# Patient Record
Sex: Female | Born: 1965 | Race: White | Hispanic: No | Marital: Married | Smoking: Never smoker
Health system: Southern US, Community
[De-identification: ages and names within clinical notes are randomized; demographics above are authoritative.]

## PROBLEM LIST (undated history)

## (undated) DIAGNOSIS — T148XXA Other injury of unspecified body region, initial encounter: Secondary | ICD-10-CM

## (undated) DIAGNOSIS — R87619 Unspecified abnormal cytological findings in specimens from cervix uteri: Secondary | ICD-10-CM

## (undated) DIAGNOSIS — E7800 Pure hypercholesterolemia, unspecified: Secondary | ICD-10-CM

## (undated) HISTORY — PX: POPLITEAL SYNOVIAL CYST EXCISION: SUR555

## (undated) HISTORY — DX: Unspecified abnormal cytological findings in specimens from cervix uteri: R87.619

## (undated) HISTORY — PX: DILATION AND CURETTAGE OF UTERUS: SHX78

## (undated) HISTORY — DX: Pure hypercholesterolemia, unspecified: E78.00

## (undated) HISTORY — DX: Other injury of unspecified body region, initial encounter: T14.8XXA

---
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
## 2000-07-06 ENCOUNTER — Encounter: Payer: Self-pay | Admitting: *Deleted

## 2000-07-06 ENCOUNTER — Ambulatory Visit (HOSPITAL_COMMUNITY): Admission: RE | Admit: 2000-07-06 | Discharge: 2000-07-06 | Payer: Self-pay | Admitting: *Deleted

## 2001-01-15 ENCOUNTER — Inpatient Hospital Stay (HOSPITAL_COMMUNITY): Admission: AD | Admit: 2001-01-15 | Discharge: 2001-01-17 | Payer: Self-pay | Admitting: Pediatrics

## 2001-02-20 ENCOUNTER — Other Ambulatory Visit: Admission: RE | Admit: 2001-02-20 | Discharge: 2001-02-20 | Payer: Self-pay | Admitting: *Deleted

## 2002-12-13 ENCOUNTER — Emergency Department (HOSPITAL_COMMUNITY): Admission: EM | Admit: 2002-12-13 | Discharge: 2002-12-13 | Payer: Self-pay | Admitting: Emergency Medicine

## 2003-01-30 ENCOUNTER — Other Ambulatory Visit: Admission: RE | Admit: 2003-01-30 | Discharge: 2003-01-30 | Payer: Self-pay | Admitting: Obstetrics and Gynecology

## 2004-06-28 ENCOUNTER — Other Ambulatory Visit: Admission: RE | Admit: 2004-06-28 | Discharge: 2004-06-28 | Payer: Self-pay | Admitting: Obstetrics and Gynecology

## 2005-07-12 ENCOUNTER — Other Ambulatory Visit: Admission: RE | Admit: 2005-07-12 | Discharge: 2005-07-12 | Payer: Self-pay | Admitting: Obstetrics and Gynecology

## 2006-04-02 ENCOUNTER — Ambulatory Visit (HOSPITAL_COMMUNITY): Admission: RE | Admit: 2006-04-02 | Discharge: 2006-04-02 | Payer: Self-pay | Admitting: Obstetrics and Gynecology

## 2006-07-17 ENCOUNTER — Other Ambulatory Visit: Admission: RE | Admit: 2006-07-17 | Discharge: 2006-07-17 | Payer: Self-pay | Admitting: Obstetrics & Gynecology

## 2007-04-04 ENCOUNTER — Ambulatory Visit (HOSPITAL_COMMUNITY): Admission: RE | Admit: 2007-04-04 | Discharge: 2007-04-04 | Payer: Self-pay | Admitting: Obstetrics and Gynecology

## 2007-07-23 ENCOUNTER — Other Ambulatory Visit: Admission: RE | Admit: 2007-07-23 | Discharge: 2007-07-23 | Payer: Self-pay | Admitting: Obstetrics & Gynecology

## 2008-04-30 ENCOUNTER — Ambulatory Visit (HOSPITAL_COMMUNITY): Admission: RE | Admit: 2008-04-30 | Discharge: 2008-04-30 | Payer: Self-pay | Admitting: Obstetrics and Gynecology

## 2008-08-10 ENCOUNTER — Other Ambulatory Visit: Admission: RE | Admit: 2008-08-10 | Discharge: 2008-08-10 | Payer: Self-pay | Admitting: Obstetrics and Gynecology

## 2009-05-03 ENCOUNTER — Ambulatory Visit (HOSPITAL_COMMUNITY): Admission: RE | Admit: 2009-05-03 | Discharge: 2009-05-03 | Payer: Self-pay | Admitting: Obstetrics and Gynecology

## 2010-05-04 ENCOUNTER — Ambulatory Visit (HOSPITAL_COMMUNITY): Admission: RE | Admit: 2010-05-04 | Discharge: 2010-05-04 | Payer: Self-pay | Admitting: Obstetrics and Gynecology

## 2011-02-17 NOTE — H&P (Signed)
[facility]  Patient:    Rebecca Browning, Rebecca Browning                         MRN: [id number] Adm. Date:  01/14/01 Attending:  Marina Gravel, M.D.                         History and Physical  ADMISSION DIAGNOSES:          1. Intrauterine pregnancy at 39-6/7th weeks.                               2. History of precipitous labor.  INTENDED PROCEDURE:           Artificial rupture of membranes for induction of labor.  SURGEON:                      Marina Gravel, M.D.  INDICATIONS FOR PROCEDURE:    The patient is a 45 year old white female, gravida 4 para 2 A1, at 39-6/7th weeks, who presents for rupture of membranes for induction of labor due to history and precipitous labor.  Prenatal care at [facility], Dr. Earlene Plater as primary.  No complications.  The patient has advanced maternal age and had a normal ultrasound and AFP.  She declined amniocentesis.  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 The patient has been on Valtrex prophylaxis for 35 weeks and has had no symptoms.  She has a history of group B Streptococcus in previous pregnancies but the test from this pregnancy at 35 weeks was negative.  PAST OBSTETRIC HISTORY:       1. Blighted ovum with D&C in 1994, no                                  complications.                               2. In 1995 delivered a 7 pound 4 ounce SVD, no                                  complications except for elevated blood                                  pressure at the end of the pregnancy.                               3. In 1998 delivery of a 7 pound 11 ounce                                  spontaneous vaginal delivery.  Total time of                                  labor was two to three hours.  Otherwise, no  complications.  PAST MEDICAL HISTORY:         HSV as outlined above.  No recent symptoms.  PAST SURGICAL HISTORY:        Knee cyst removed.  FAMILY HISTORY:                Hypertension and coronary artery disease.  SOCIAL HISTORY:               No alcohol, tobacco, or other drugs.  ALLERGIES:                    None.  PRENATAL LABORATORY DATA:     Blood type A-positive.  Rubella immune. Hepatitis B, HIV, RPR, group B strep all negative.  Glucola 130.  PHYSICAL EXAMINATION:  VITAL SIGNS:                  Blood pressure 120/70 and after recheck 140/92. Weight 156 pounds.  Fetal heart tones 146.  GENERAL:                      Alert and oriented, in no acute distress.  SKIN:                         Warm without lesions.  HEART:                        Regular rate and rhythm.  LUNGS:                        Clear to auscultation.  ABDOMEN:                      Gravid.  Estimated fetal weight 7-1/2 pounds.  PELVIC:                       Cervix 1-2 cm, 50% effaced, -1 station, vertex presentation.  No HSV lesions noted.  ASSESSMENT:                   Term intrauterine pregnancy, with history of rapid labor, with favorable cervix.  PLAN:                         Artificial rupture of membranes for induction of labor. DD:  01/14/01 TD:  01/14/01 Job: [id number]

## 2011-04-03 ENCOUNTER — Other Ambulatory Visit: Payer: Self-pay | Admitting: Obstetrics and Gynecology

## 2011-04-03 DIAGNOSIS — Z1231 Encounter for screening mammogram for malignant neoplasm of breast: Secondary | ICD-10-CM

## 2011-05-08 ENCOUNTER — Ambulatory Visit (HOSPITAL_COMMUNITY)
Admission: RE | Admit: 2011-05-08 | Discharge: 2011-05-08 | Disposition: A | Discharge status: Home / Self Care | Payer: Medicare HMO | Source: Ambulatory Visit | Attending: Obstetrics and Gynecology | Admitting: Obstetrics and Gynecology

## 2011-05-08 DIAGNOSIS — Z1231 Encounter for screening mammogram for malignant neoplasm of breast: Secondary | ICD-10-CM

## 2012-04-01 ENCOUNTER — Other Ambulatory Visit: Payer: Self-pay | Admitting: Obstetrics and Gynecology

## 2012-04-01 DIAGNOSIS — Z1231 Encounter for screening mammogram for malignant neoplasm of breast: Secondary | ICD-10-CM

## 2012-05-09 ENCOUNTER — Ambulatory Visit (HOSPITAL_COMMUNITY)
Admission: RE | Admit: 2012-05-09 | Discharge: 2012-05-09 | Disposition: A | Discharge status: Home / Self Care | Payer: Medicare HMO | Source: Ambulatory Visit | Attending: Obstetrics and Gynecology | Admitting: Obstetrics and Gynecology

## 2012-05-09 DIAGNOSIS — Z1231 Encounter for screening mammogram for malignant neoplasm of breast: Secondary | ICD-10-CM

## 2013-04-14 ENCOUNTER — Other Ambulatory Visit: Payer: Self-pay | Admitting: Obstetrics and Gynecology

## 2013-04-14 DIAGNOSIS — Z1231 Encounter for screening mammogram for malignant neoplasm of breast: Secondary | ICD-10-CM

## 2013-05-12 ENCOUNTER — Ambulatory Visit (HOSPITAL_COMMUNITY)
Admission: RE | Admit: 2013-05-12 | Discharge: 2013-05-12 | Disposition: A | Discharge status: Home / Self Care | Payer: 59 | Source: Ambulatory Visit | Attending: Obstetrics and Gynecology | Admitting: Obstetrics and Gynecology

## 2013-05-12 DIAGNOSIS — Z1231 Encounter for screening mammogram for malignant neoplasm of breast: Secondary | ICD-10-CM | POA: Insufficient documentation

## 2013-08-20 ENCOUNTER — Encounter: Payer: Self-pay | Admitting: Nurse Practitioner

## 2013-08-21 ENCOUNTER — Encounter: Payer: Self-pay | Admitting: Nurse Practitioner

## 2013-08-21 ENCOUNTER — Ambulatory Visit (INDEPENDENT_AMBULATORY_CARE_PROVIDER_SITE_OTHER): Payer: Managed Care, Other (non HMO) | Admitting: Nurse Practitioner

## 2013-08-21 VITALS — BP 120/78 | HR 56 | Resp 16 | Ht 65.0 in | Wt 153.0 lb

## 2013-08-21 DIAGNOSIS — Z0000 Encounter for general adult medical examination without abnormal findings: Secondary | ICD-10-CM

## 2013-08-21 DIAGNOSIS — Z01419 Encounter for gynecological examination (general) (routine) without abnormal findings: Secondary | ICD-10-CM

## 2013-08-21 LAB — POCT URINALYSIS DIPSTICK
Bilirubin, UA: NEGATIVE
Blood, UA: NEGATIVE
Glucose, UA: NEGATIVE
Ketones, UA: NEGATIVE

## 2013-08-21 LAB — HEMOGLOBIN, FINGERSTICK: Hemoglobin, fingerstick: 13.4 g/dL (ref 12.0–16.0)

## 2013-08-21 NOTE — Patient Instructions (Signed)

## 2013-08-21 NOTE — Progress Notes (Signed)
Patient ID: Rebecca Browning, female   DOB: [birth year], 47 y.o.   MRN: [id number] 47 y.o. [id number] Married Caucasian Fe here for annual exam.  No new health problems. Still working on cholesterol.  No significant vaso symptoms.  Amenorrhea since 12/2009 with negative Provera challenge.  FSH 115.2  in 08/2010.  No LMP recorded. Patient is postmenopausal.          Sexually active: yes  The current method of family planning is post menopausal status.    Exercising: yes  Gym/ health club routine includes light weights, stair stepper , treadmill and yoga. Smoker:  no  Health Maintenance: Pap: 08/19/12, WNL, neg HR HPV MMG: 05/12/13, Bi-Rads 1 Negative TDaP: 08/10/08 Labs: HB: 13.4  Urine: 1+ WBC, pH 6.0   reports that she has never smoked. She has never used smokeless tobacco. She reports that she drinks alcohol. She reports that she does not use illicit drugs.  History reviewed. No pertinent past medical history.  Past Surgical History  Procedure Laterality Date  . Dilation and curettage of uterus      SAB  . Popliteal synovial cyst excision  1970's    Current Outpatient Prescriptions  Medication Sig Dispense Refill  . Multiple Vitamin (MULTIVITAMIN) tablet Take 1 tablet by mouth daily. [employer] Multipack       No current facility-administered medications for this visit.    Family History  Problem Relation Age of Onset  . Breast cancer Sister 110  . Hypertension Mother   . Heart attack Father   . Heart disease Father     CABG  . Hypertension Father   . Hyperlipidemia Father     ROS:  Pertinent items are noted in HPI.  Otherwise, a comprehensive ROS was negative.  Exam:   BP 120/78  Pulse 56  Resp 16  Ht 5\' 5"  (1.651 m)  Wt 153 lb (69.4 kg)  BMI 25.46 kg/m2 Height: 5\' 5"  (165.1 cm)  Ht Readings from Last 3 Encounters:  08/21/13 5\' 5"  (1.651 m)    General appearance: alert, cooperative and appears stated age Head: Normocephalic, without obvious abnormality, atraumatic Neck: no  adenopathy, supple, symmetrical, trachea midline and thyroid normal to inspection and palpation Lungs: clear to auscultation bilaterally Breasts: normal appearance, no masses or tenderness Heart: regular rate and rhythm Abdomen: soft, non-tender; no masses,  no organomegaly Extremities: extremities normal, atraumatic, no cyanosis or edema Skin: Skin color, texture, turgor normal. No rashes or lesions Lymph nodes: Cervical, supraclavicular, and axillary nodes normal. No abnormal inguinal nodes palpated Neurologic: Grossly normal   Pelvic: External genitalia:  no lesions              Urethra:  normal appearing urethra with no masses, tenderness or lesions              Bartholin's and Skene's: normal                 Vagina: normal appearing vagina with normal color and discharge, no lesions              Cervix: anteverted              Pap taken: no Bimanual Exam:  Uterus:  normal size, contour, position, consistency, mobility, non-tender              Adnexa: no mass, fullness, tenderness               Rectovaginal: Confirms  Anus:  normal sphincter tone, no lesions  A:  Well Woman with normal exam  Postmenopausal no HRT  [facility] of sister with breast cancer - no BRCA testing - pt aware that she can have testing done.   P:   Pap smear as per guidelines   Mammogram due 05/2014  Info given about BRCA  Will follow with labs  Counseled on breast self exam, adequate intake of calcium and vitamin D, diet and exercise return annually or prn  An After Visit Summary was printed and given to the patient.

## 2013-08-22 LAB — LIPID PANEL
Cholesterol: 216 mg/dL — ABNORMAL HIGH (ref 0–200)
LDL Cholesterol: 138 mg/dL — ABNORMAL HIGH (ref 0–99)
Total CHOL/HDL Ratio: 3.6 Ratio
VLDL: 18 mg/dL (ref 0–40)

## 2013-08-25 NOTE — Progress Notes (Signed)
Encounter reviewed by Dr. Brook Silva.  

## 2013-08-27 ENCOUNTER — Telehealth: Payer: Self-pay | Admitting: *Deleted

## 2013-08-27 NOTE — Telephone Encounter (Signed)
I have attempted to contact this patient by phone with the following results: left message to return my call on answering machine (home).  

## 2013-08-27 NOTE — Telephone Encounter (Signed)
Message copied by Luisa Dago on Wed Aug 27, 2013 12:12 PM ------      Message from: Ria Comment R      Created: Fri Aug 22, 2013  8:19 AM       Let patient know results.  past lipid panel showed elevated cholesterol of 262 11/13, recheck/14 was 249 and now at 216 - much better!  Good work with diet and supplements.  LDL was 171 11/13 and now at 138 - still needs some work but better. ------

## 2013-09-01 NOTE — Telephone Encounter (Signed)
Pt notified of cholesterol results.

## 2014-03-11 ENCOUNTER — Ambulatory Visit (INDEPENDENT_AMBULATORY_CARE_PROVIDER_SITE_OTHER): Payer: Managed Care, Other (non HMO) | Admitting: Nurse Practitioner

## 2014-03-11 ENCOUNTER — Encounter: Payer: Self-pay | Admitting: Nurse Practitioner

## 2014-03-11 ENCOUNTER — Telehealth: Payer: Self-pay | Admitting: Nurse Practitioner

## 2014-03-11 VITALS — BP 122/76 | HR 64 | Ht 65.0 in | Wt 156.0 lb

## 2014-03-11 DIAGNOSIS — B373 Candidiasis of vulva and vagina: Secondary | ICD-10-CM

## 2014-03-11 DIAGNOSIS — B3731 Acute candidiasis of vulva and vagina: Secondary | ICD-10-CM

## 2014-03-11 MED ORDER — NYSTATIN-TRIAMCINOLONE 100000-0.1 UNIT/GM-% EX OINT: 1.0000 "application " | TOPICAL_OINTMENT | Freq: Two times a day (BID) | CUTANEOUS | Status: DC

## 2014-03-11 MED ORDER — FLUCONAZOLE 150 MG PO TABS
150.0000 mg | ORAL_TABLET | Freq: Once | ORAL | Status: DC
Start: 2014-03-11 — End: 2014-08-31

## 2014-03-11 NOTE — Patient Instructions (Signed)
Monilial Vaginitis  Vaginitis in a soreness, swelling and redness (inflammation) of the vagina and vulva. Monilial vaginitis is not a sexually transmitted infection.  CAUSES   Yeast vaginitis is caused by yeast (candida) that is normally found in your vagina. With a yeast infection, the candida has overgrown in number to a point that upsets the chemical balance.  SYMPTOMS   · White, thick vaginal discharge.  · Swelling, itching, redness and irritation of the vagina and possibly the lips of the vagina (vulva).  · Burning or painful urination.  · Painful intercourse.  DIAGNOSIS   Things that may contribute to monilial vaginitis are:  · Postmenopausal and virginal states.  · Pregnancy.  · Infections.  · Being tired, sick or stressed, especially if you had monilial vaginitis in the past.  · Diabetes. Good control will help lower the chance.  · Birth control pills.  · Tight fitting garments.  · Using bubble bath, feminine sprays, douches or deodorant tampons.  · Taking certain medications that kill germs (antibiotics).  · Sporadic recurrence can occur if you become ill.  TREATMENT   Your caregiver will give you medication.  · There are several kinds of anti monilial vaginal creams and suppositories specific for monilial vaginitis. For recurrent yeast infections, use a suppository or cream in the vagina 2 times a week, or as directed.  · Anti-monilial or steroid cream for the itching or irritation of the vulva may also be used. Get your caregiver's permission.  · Painting the vagina with methylene blue solution may help if the monilial cream does not work.  · Eating yogurt may help prevent monilial vaginitis.  HOME CARE INSTRUCTIONS   · Finish all medication as prescribed.  · Do not have sex until treatment is completed or after your caregiver tells you it is okay.  · Take warm sitz baths.  · Do not douche.  · Do not use tampons, especially scented ones.  · Wear cotton underwear.  · Avoid tight pants and panty  hose.  · Tell your sexual partner that you have a yeast infection. They should go to their caregiver if they have symptoms such as mild rash or itching.  · Your sexual partner should be treated as well if your infection is difficult to eliminate.  · Practice safer sex. Use condoms.  · Some vaginal medications cause latex condoms to fail. Vaginal medications that harm condoms are:  · Cleocin cream.  · Butoconazole (Femstat®).  · Terconazole (Terazol®) vaginal suppository.  · Miconazole (Monistat®) (may be purchased over the counter).  SEEK MEDICAL CARE IF:   · You have a temperature by mouth above 102° F (38.9° C).  · The infection is getting worse after 2 days of treatment.  · The infection is not getting better after 3 days of treatment.  · You develop blisters in or around your vagina.  · You develop vaginal bleeding, and it is not your menstrual period.  · You have pain when you urinate.  · You develop intestinal problems.  · You have pain with sexual intercourse.  Document Released: 06/28/2005 Document Revised: 12/11/2011 Document Reviewed: 03/12/2009  Exit[employer]® Patient Information ©2014 [employer].

## 2014-03-11 NOTE — Telephone Encounter (Signed)
Pt wants to talk with nurse no information given. °

## 2014-03-11 NOTE — Progress Notes (Signed)
48 y.o.Married Caucasian female [phone] with a 2 week(s) history of the following :burning and discharge described as white, curd-like and milky.   Sexually active: yes Last sexual activity:4days ago. Pt also reports the following associated symptoms: none.  Patient has tried over the counter treatment with Monistat with minimal relief. No change in soaps detergents, or personal care items.   No recent antibiotics.       Exam:  Ext:  Redness and irritation extending down to the anus.                HYQ:MVHQIONGE: white, thick and curd-like                Cx:  normal appearance                Uterus:not examined                Adnexa: not evaluated  Wet Prep shows:yeast, NSS: negative, PH: 4.5   Dx: monilia vaginitis   TX  :Symptomatic local care discussed. Transport planner distributed. Vaginal antifungal see orders. Oral antifungal see orders.

## 2014-03-11 NOTE — Telephone Encounter (Signed)
Spoke with patient. Patient states that two weeks ago she began to have vaginal itching and discharge. Patient used OTC Monistat 3 and symptoms went away for a couple of days but have now returned. Patient would like rx for Diflucan sent in to pharmacy. Advised patient that she will need to come in for office visit with Lauro Franklin, FNP for culture and proper treatment. Patient agreeable. 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 Patient states that she could come today if there are openings. Appointment scheduled for today at 12:45pm with Lauro Franklin, FNP. Patient agreeable to date and time.  Routing to provider for final review. Patient agreeable to disposition. Will close encounter

## 2014-03-16 NOTE — Progress Notes (Signed)
Encounter reviewed by Dr. Adrina Armijo Silva.  

## 2014-04-29 ENCOUNTER — Other Ambulatory Visit: Payer: Self-pay | Admitting: Nurse Practitioner

## 2014-04-29 DIAGNOSIS — Z1231 Encounter for screening mammogram for malignant neoplasm of breast: Secondary | ICD-10-CM

## 2014-05-20 ENCOUNTER — Ambulatory Visit (HOSPITAL_COMMUNITY)
Admission: RE | Admit: 2014-05-20 | Discharge: 2014-05-20 | Disposition: A | Discharge status: Home / Self Care | Payer: 59 | Source: Ambulatory Visit | Attending: Nurse Practitioner | Admitting: Nurse Practitioner

## 2014-05-20 DIAGNOSIS — Z1231 Encounter for screening mammogram for malignant neoplasm of breast: Secondary | ICD-10-CM | POA: Insufficient documentation

## 2014-08-03 ENCOUNTER — Encounter: Payer: Self-pay | Admitting: Nurse Practitioner

## 2014-08-19 HISTORY — PX: LIPOSUCTION: SHX10

## 2014-08-31 ENCOUNTER — Encounter: Payer: Self-pay | Admitting: Nurse Practitioner

## 2014-08-31 ENCOUNTER — Ambulatory Visit (INDEPENDENT_AMBULATORY_CARE_PROVIDER_SITE_OTHER): Payer: Managed Care, Other (non HMO) | Admitting: Nurse Practitioner

## 2014-08-31 VITALS — BP 108/66 | HR 72 | Ht 65.75 in | Wt 157.0 lb

## 2014-08-31 DIAGNOSIS — Z01419 Encounter for gynecological examination (general) (routine) without abnormal findings: Secondary | ICD-10-CM

## 2014-08-31 DIAGNOSIS — Z1211 Encounter for screening for malignant neoplasm of colon: Secondary | ICD-10-CM

## 2014-08-31 DIAGNOSIS — Z0000 Encounter for general adult medical examination without abnormal findings: Secondary | ICD-10-CM

## 2014-08-31 DIAGNOSIS — R8290 Unspecified abnormal findings in urine: Secondary | ICD-10-CM

## 2014-08-31 LAB — POCT URINALYSIS DIPSTICK
BILIRUBIN UA: NEGATIVE
GLUCOSE UA: NEGATIVE
Ketones, UA: NEGATIVE
NITRITE UA: NEGATIVE
RBC UA: NEGATIVE
Urobilinogen, UA: NEGATIVE
pH, UA: 5

## 2014-08-31 LAB — HEMOGLOBIN, FINGERSTICK: HEMOGLOBIN, FINGERSTICK: 11.7 g/dL — AB (ref 12.0–16.0)

## 2014-08-31 NOTE — Patient Instructions (Signed)

## 2014-08-31 NOTE — Progress Notes (Signed)
Patient ID: Rebecca Browning, female   DOB: 1966-02-05, 48 y.o.   MRN: [id number] 48 y.o. [id number] Married Caucasian Fe here for annual exam.  Vaso symptoms are tolerable. She had liposuction done a week ago on the thighs.  Patient's last menstrual period was 01/03/2010 (exact date).          Sexually active: yes  The current method of family planning is post menopausal status and vasectomy.  Exercising: yes Gym/ health club routine includes light weights, stair stepper , treadmill and yoga. Smoker: no  Health Maintenance: Pap: 08/19/12, WNL, neg HR HPV MMG: 05/20/14, Bi-Rads 1:  Negative TDaP: 08/10/08 Labs:  HB:  11.7   Urine:  1+ leuk's    reports that she has never smoked. She has never used smokeless tobacco. She reports that she drinks alcohol. She reports that she does not use illicit drugs.  History reviewed. No pertinent past medical history.  Past Surgical History  Procedure Laterality Date  . Dilation and curettage of uterus      SAB  . Popliteal synovial cyst excision  1970's  . Liposuction Bilateral 08/19/14    thighs    Current Outpatient Prescriptions  Medication Sig Dispense Refill  . Multiple Vitamin (MULTIVITAMIN) tablet Take 1 tablet by mouth daily. [place] Multipack     No current facility-administered medications for this visit.    Family History  Problem Relation Age of Onset  . Breast cancer Sister 78  . Hypertension Mother   . Heart attack Father   . Heart disease Father     CABG  . Hypertension Father   . Hyperlipidemia Father     ROS:  Pertinent items are noted in HPI.  Otherwise, a comprehensive ROS was negative.  Exam:   BP 108/66 mmHg  Pulse 72  Ht 5' 5.75" (1.67 m)  Wt 157 lb (71.215 kg)  BMI 25.54 kg/m2  LMP 01/03/2010 (Exact Date) Height: 5' 5.75" (167 cm)  Ht Readings from Last 3 Encounters:  08/31/14 5' 5.75" (1.67 m)  03/11/14 5' 5"  (1.651 m)  08/21/13 5' 5"  (1.651 m)    General appearance: alert, cooperative and appears  stated age Head: Normocephalic, without obvious abnormality, atraumatic Neck: no adenopathy, supple, symmetrical, trachea midline and thyroid normal to inspection and palpation Lungs: clear to auscultation bilaterally Breasts: normal appearance, no masses or tenderness Heart: regular rate and rhythm Abdomen: soft, non-tender; no masses,  no organomegaly Extremities: extremities normal, atraumatic, no cyanosis or edema Skin: Skin color, texture, turgor normal. No rashes or lesions.  Bilateral bruising of the thighs Lymph nodes: Cervical, supraclavicular, and axillary nodes normal. No abnormal inguinal nodes palpated Neurologic: Grossly normal   Pelvic: External genitalia:  no lesions              Urethra:  normal appearing urethra with no masses, tenderness or lesions              Bartholin's and Skene's: normal                 Vagina: normal appearing vagina with normal color and discharge, no lesions              Cervix: anteverted              Pap taken: No. Bimanual Exam:  Uterus:  normal size, contour, position, consistency, mobility, non-tender              Adnexa: no mass, fullness, tenderness  Rectovaginal: Confirms               Anus:  normal sphincter tone, no lesions  A:  Well Woman with normal exam  Postmenopausal no HRT [facility] of sister with breast cancer - no BRCA testing - pt aware that she can have testing done.  P:   Reviewed health and wellness pertinent to exam  Pap smear not taken today  Mammogram is due 8/16  Follow with urine C&S - asymptomatic  Referral to GI for screening colonoscopy  Counseled on breast self exam, mammography screening, adequate intake of calcium and vitamin D, diet and exercise return annually or prn  An After Visit Summary was printed and given to the patient.

## 2014-08-31 NOTE — Progress Notes (Signed)
Encounter reviewed by Dr. Brook Silva.  

## 2014-09-01 LAB — URINE CULTURE

## 2014-09-15 LAB — HM COLONOSCOPY: HM Colonoscopy: NORMAL

## 2014-10-20 ENCOUNTER — Telehealth: Payer: Self-pay | Admitting: Nurse Practitioner

## 2014-10-20 NOTE — Telephone Encounter (Signed)
Please have mom come with the daughter.  2 sets of ears to hear all the questions and answers are better.

## 2014-10-20 NOTE — Telephone Encounter (Signed)
Patient has some for Rebecca GoltzPatty Browning. Patient is wondering Rebecca Goltzatty Browning would be the appropriate person to see her daughter before scheduling an appointment. Patient's [facility]

## 2014-10-20 NOTE — Telephone Encounter (Signed)
Spoke with patient. Patient states "I have been a patient with [name] for years. My daughter is now 6918 and sees a dermatologist for acne. They want to put her on acutane but say she needs to be on birth control. Is this something she would come to see [name] for as a new patient?" Advised she will be able to see Rebecca FranklinPatricia Rolen-Grubb, FNP for a new patient appointment to discuss birth control. Advised will need to schedule new patient appointment for daughter. Mother is agreeable. Pass phone call to Starla to schedule new patient appointment.   Routing to provider for final review. Patient agreeable to disposition. Will close encounter

## 2014-10-21 NOTE — Telephone Encounter (Signed)
Spoke with patient. Advised of message as seen below. Patient [facility] Lauro FranklinPatricia Rolen-Grubb, FNP and she will attend appointment.  Routing to provider for final review. Patient agreeable to disposition. Will close encounter

## 2014-11-17 ENCOUNTER — Telehealth: Payer: Self-pay | Admitting: Nurse Practitioner

## 2014-11-17 NOTE — Telephone Encounter (Signed)
Needs OV to evaluate

## 2014-11-17 NOTE — Telephone Encounter (Signed)
Spoke with patient. Advised of message as seen below from Lauro FranklinPatricia Rolen-Grubb, FNP. Patient is agreeable and verbalizes understanding. 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 Appointment scheduled for 2/22 at 12:45pm with Lauro FranklinPatricia Rolen-Grubb, FNP. Patient is agreeable to date and time.  Routing to provider for final review. Patient agreeable to disposition. Will close encounter

## 2014-11-17 NOTE — Telephone Encounter (Signed)
Patient has some question for [name] Grubb's nurse. No details given. Last seen 08/31/14.

## 2014-11-17 NOTE — Telephone Encounter (Signed)
Spoke with patient. Patient states that when she was seen last on 08/31/2014 with Rebecca FranklinPatricia Rolen-Grubb, FNP "She noticed a rash that I have around my anal area. It has been there for a while. She mentioned it may be fungal but I thought it may be due to moisture. Over the last two days it has become more irritated and itches/burns. It comes and goes." Denies any spreading of the rash. No change in BM's. Patient is requesting to know what Rebecca FranklinPatricia Rolen-Grubb, FNP recommends she use to "treat" the area. Advised will need to speak with Rebecca FranklinPatricia Rolen-Grubb, FNP and return call with further recommendations. Patient is agreeable.

## 2014-11-23 ENCOUNTER — Ambulatory Visit: Payer: Managed Care, Other (non HMO) | Admitting: Nurse Practitioner

## 2014-11-23 ENCOUNTER — Telehealth: Payer: Self-pay | Admitting: Nurse Practitioner

## 2014-11-23 NOTE — Telephone Encounter (Signed)
Pt has appt today @1245 . States she is not having the problem any more but wants to make sure with our nurse before cancelling appointment.  Tagged as high priority due to time frame before appointment

## 2014-11-23 NOTE — Telephone Encounter (Signed)
Call to patient. She states her symptoms have resolved and does not feel appointment is necessary at this time. Patient wishes to cancel, but states she will come in if there will be cancellation fee applied. Spoke with Billie RuddySally Yeakley, RN  Okay to cancel appointment, no fee applied, since triaged issues have resolved.   Appointment cancelled.   Routing to provider for final review. Patient agreeable to disposition. Will close encounter

## 2015-04-09 ENCOUNTER — Other Ambulatory Visit: Payer: Self-pay | Admitting: Nurse Practitioner

## 2015-04-09 DIAGNOSIS — Z1231 Encounter for screening mammogram for malignant neoplasm of breast: Secondary | ICD-10-CM

## 2015-05-25 ENCOUNTER — Ambulatory Visit (HOSPITAL_COMMUNITY)
Admission: RE | Admit: 2015-05-25 | Discharge: 2015-05-25 | Disposition: A | Discharge status: Home / Self Care | Payer: Managed Care, Other (non HMO) | Source: Ambulatory Visit | Attending: Nurse Practitioner | Admitting: Nurse Practitioner

## 2015-05-25 DIAGNOSIS — Z1231 Encounter for screening mammogram for malignant neoplasm of breast: Secondary | ICD-10-CM | POA: Diagnosis not present

## 2015-09-08 ENCOUNTER — Encounter: Payer: Self-pay | Admitting: Nurse Practitioner

## 2015-09-08 ENCOUNTER — Ambulatory Visit (INDEPENDENT_AMBULATORY_CARE_PROVIDER_SITE_OTHER): Payer: Managed Care, Other (non HMO) | Admitting: Nurse Practitioner

## 2015-09-08 VITALS — BP 116/74 | HR 62 | Ht 64.75 in | Wt 158.0 lb

## 2015-09-08 DIAGNOSIS — Z01419 Encounter for gynecological examination (general) (routine) without abnormal findings: Secondary | ICD-10-CM

## 2015-09-08 DIAGNOSIS — Z0000 Encounter for general adult medical examination without abnormal findings: Secondary | ICD-10-CM | POA: Diagnosis not present

## 2015-09-08 LAB — WET PREP BY MOLECULAR PROBE
Candida species: NEGATIVE
Gardnerella vaginalis: NEGATIVE
TRICHOMONAS VAG: NEGATIVE

## 2015-09-08 NOTE — Patient Instructions (Signed)

## 2015-09-08 NOTE — Progress Notes (Signed)
Patient ID: Rebecca Browning, female   DOB: 02-11-1966, 50 y.o.   MRN: [id number]  49 y.o. [id number] Married  Caucasian Fe here for annual exam.   Mother age 44 now diagnosed with breast cancer lumpectomy and radiation. Will call [facility] to get further info on BRCA testing.  No vaso symptoms, sleep is good, some vaginal dryness.  Patient's last menstrual period was 01/03/2010 (exact date).          Sexually active: Yes.    The current method of family planning is post menopausal status.    Exercising: Yes.    yoga, cardio 3 times per week and tennis Smoker:  no  Health Maintenance: Pap: 08/19/12, Negative with neg HR HPV MMG: 05/25/15, Bi-Rads 1 Negative Colonoscopy:  09/2014, Normal, repeat in 10 years, Dr. Collene Mares TDaP: 08/10/08 Labs:  Dr. Philip Aspen takes care of all labs   reports that she has never smoked. She has never used smokeless tobacco. She reports that she drinks alcohol. She reports that she does not use illicit drugs.  History reviewed. No pertinent past medical history.  Past Surgical History  Procedure Laterality Date  . Dilation and curettage of uterus      SAB  . Popliteal synovial cyst excision  1970's  . Liposuction Bilateral 08/19/14    thighs    Current Outpatient Prescriptions  Medication Sig Dispense Refill  . Multiple Vitamin (MULTIVITAMIN) tablet Take 1 tablet by mouth daily. [employer] Multipack    . zolpidem (AMBIEN) 10 MG tablet Take 1 tablet by mouth at bedtime as needed.  3   No current facility-administered medications for this visit.    Family History  Problem Relation Age of Onset  . Breast cancer Sister 71  . Hypertension Mother   . Breast cancer Mother 43    lumpectomy, radiation  . Heart attack Father   . Heart disease Father     CABG  . Hypertension Father   . Hyperlipidemia Father     ROS:  Pertinent items are noted in HPI.  Otherwise, a comprehensive ROS was negative.  Exam:   BP 116/74 mmHg  Pulse 62  Ht 5' 4.75" (1.645 m)  Wt 158 lb  (71.668 kg)  BMI 26.48 kg/m2  LMP 01/03/2010 (Exact Date) Height: 5' 4.75" (164.5 cm) Ht Readings from Last 3 Encounters:  09/08/15 5' 4.75" (1.645 m)  08/31/14 5' 5.75" (1.67 m)  03/11/14 5' 5"  (1.651 m)    General appearance: alert, cooperative and appears stated age Head: Normocephalic, without obvious abnormality, atraumatic Neck: no adenopathy, supple, symmetrical, trachea midline and thyroid normal to inspection and palpation Lungs: clear to auscultation bilaterally Breasts: normal appearance, no masses or tenderness Heart: regular rate and rhythm Abdomen: soft, non-tender; no masses,  no organomegaly Extremities: extremities normal, atraumatic, no cyanosis or edema Skin: Skin color, texture, turgor normal. No rashes or lesions Lymph nodes: Cervical, supraclavicular, and axillary nodes normal. No abnormal inguinal nodes palpated Neurologic: Grossly normal   Pelvic: External genitalia:  no lesions              Urethra:  normal appearing urethra with no masses, tenderness or lesions              Bartholin's and Skene's: normal                 Vagina: normal appearing vagina with normal color and yellow tint vaginal discharge, no lesions              Cervix:  anteverted              Pap taken: Yes.   Bimanual Exam:  Uterus:  normal size, contour, position, consistency, mobility, non-tender              Adnexa: no mass, fullness, tenderness               Rectovaginal: Confirms               Anus:  normal sphincter tone, no lesions  Chaperone present: yes  A:  Well Woman with normal exam  Postmenopausal no HRT [facility] of mother & sister with breast cancer - no BRCA testing - pt aware that she can have testing done and given [facility] information  R/O vagintis   P:   Reviewed health and wellness pertinent to exam  Pap smear as above  Mammogram is due 05/2016  Will follow with [facility]  Mother age 51 now diagnosed with breast cancer lumpectomy and radiation.  Sister in  law also diagnosed with breast cancer age 87.  She has 3 daughters,  Will call [facility] to get further info on BRCA testing  Counseled on breast self exam, mammography screening, adequate intake of calcium and vitamin D, diet and exercise return annually or prn  An After Visit Summary was printed and given to the patient.

## 2015-09-08 NOTE — Progress Notes (Signed)
Encounter reviewed Jill Jertson, MD   

## 2015-09-09 ENCOUNTER — Telehealth: Payer: Self-pay

## 2015-09-09 LAB — HIV ANTIBODY (ROUTINE TESTING W REFLEX): HIV 1&2 Ab, 4th Generation: NONREACTIVE

## 2015-09-09 LAB — HEPATITIS C ANTIBODY: HCV Ab: NEGATIVE

## 2015-09-09 NOTE — Telephone Encounter (Signed)
Returned call and left a message on voicemail during lunch to please call her back.

## 2015-09-09 NOTE — Telephone Encounter (Signed)
Spoke with patient. Results given as seen below. Patient is agreeable and verbalizes understanding.  Routing to provider for final review. Patient agreeable to disposition. Will close encounter   

## 2015-09-09 NOTE — Telephone Encounter (Signed)
-----   Message from Ria CommentPatricia Grubb, FNP sent at 09/09/2015  8:29 AM EST ----- Please let patient know that the wet prep was negative.  The HIV and Hep C was negative as suspected.

## 2015-09-09 NOTE — Telephone Encounter (Signed)
Left message to call Landri Dorsainvil at [phone]. 

## 2015-09-10 LAB — IPS PAP TEST WITH HPV

## 2016-02-16 ENCOUNTER — Encounter: Payer: Self-pay | Admitting: Nurse Practitioner

## 2016-02-16 ENCOUNTER — Telehealth: Payer: Self-pay | Admitting: *Deleted

## 2016-02-16 ENCOUNTER — Ambulatory Visit (INDEPENDENT_AMBULATORY_CARE_PROVIDER_SITE_OTHER): Payer: Managed Care, Other (non HMO) | Admitting: Nurse Practitioner

## 2016-02-16 VITALS — BP 110/62 | HR 76 | Temp 97.9°F | Resp 16 | Ht 65.0 in | Wt 150.0 lb

## 2016-02-16 DIAGNOSIS — N390 Urinary tract infection, site not specified: Secondary | ICD-10-CM | POA: Diagnosis not present

## 2016-02-16 LAB — POCT URINALYSIS DIPSTICK
Bilirubin, UA: NEGATIVE
Glucose, UA: NEGATIVE
KETONES UA: NEGATIVE
NITRITE UA: NEGATIVE
PH UA: 5
Urobilinogen, UA: NEGATIVE

## 2016-02-16 MED ORDER — PHENAZOPYRIDINE HCL 200 MG PO TABS: 200.0000 mg | ORAL_TABLET | Freq: Three times a day (TID) | ORAL | Status: DC | PRN

## 2016-02-16 MED ORDER — CIPROFLOXACIN HCL 500 MG PO TABS: 500.0000 mg | ORAL_TABLET | Freq: Two times a day (BID) | ORAL | Status: DC

## 2016-02-16 MED ORDER — FLUCONAZOLE 150 MG PO TABS: 150.0000 mg | ORAL_TABLET | Freq: Once | ORAL | Status: DC

## 2016-02-16 NOTE — Telephone Encounter (Signed)
Patient thinks she has a uti, patient would like to know if something can be called in for her. Best # to reach: [phone]

## 2016-02-16 NOTE — Progress Notes (Signed)
50 y.o.Married Caucasian female [phone] here with complaint of UTI, with onset about 2 weeks ago. Patient complaining of:  Urinary frequency, urinary urgency, burning with urination. Patient denies fever, chills, nausea or back pain. No new personal products. Patient feels is to sexual activity. She was trying to be SA more often to help with vaginal atrophy changes.  Denies vaginal symptoms.    Contraception is post menopausal.  No change in partner. Post menopausal with occasional vaginal dryness. Patient has adequate water intake.  On Bactrim for a week  5/5 - 5/12 and completed all med's   Soon afterwards knew that symptoms were back. No kidney pain.  No vaginal discharge.    O: Healthy female WDWN Affect: Normal, orientation x 3 Skin : warm and dry CVAT: negative bilateral Abdomen: positive for suprapubic tenderness   POCT:  Cloudy, trace RBC, 100 mg protein, 2+ leuk's  A:  UTI  PC UTI  P: Reviewed findings of UTI and need for treatment. Rx:  Cipro 500 mg BID # 14 Rx: Pyridium 200 mg prn If culture comes back and she is sensitive to Macrobid - may also try using Macrobid PC prn. JYN:WGNFALab:Urine micro, culture Reviewed warning signs and symptoms of UTI and need to advise if occurring. Encouraged to limit soda, tea, and coffee   RV prn

## 2016-02-16 NOTE — Patient Instructions (Signed)

## 2016-02-16 NOTE — Telephone Encounter (Signed)
Spoke with patient. Patient states that she feels she has a UTI and is requesting medication be sent to the pharmacy for her. Advised she will need to be seen in the office for evaluation to ensure proper treatment. She is agreeable. Appointment scheduled for today 02/16/2016 at 12:45 pm with Ria CommentPatricia Grubb, FNP. She is agreeable to date and time.  Routing to provider for final review. Patient agreeable to disposition. Will close encounter.

## 2016-02-17 LAB — URINALYSIS, MICROSCOPIC ONLY
Bacteria, UA: NONE SEEN [HPF]
CASTS: NONE SEEN [LPF]
CRYSTALS: NONE SEEN [HPF]
RBC / HPF: NONE SEEN RBC/HPF (ref <=2)
SQUAMOUS EPITHELIAL / LPF: NONE SEEN [HPF] (ref <=5)
Yeast: NONE SEEN [HPF]

## 2016-02-18 ENCOUNTER — Telehealth: Payer: Self-pay | Admitting: *Deleted

## 2016-02-18 NOTE — Telephone Encounter (Signed)
Phone call to patient per Shirlyn GoltzPatty Grubb, FNP to advise urine culture result is still pending.  Advised pt preliminary result shows she is growing bacteria, but we do not have the sensitivity testing result.  Pt states she is feeling better on Cipro.  Patient wanted to know if Macrobid has been called in for use after intercourse.  Advised I do not see a prescription and I will ask Shirlyn GoltzPatty Grubb, FNP about this.  Please advise regarding Macrobid.

## 2016-02-19 LAB — URINE CULTURE: Colony Count: 85000

## 2016-02-20 NOTE — Progress Notes (Signed)
Encounter reviewed by Dr. Brook Amundson C. Silva.  

## 2016-02-21 ENCOUNTER — Other Ambulatory Visit: Payer: Self-pay | Admitting: Nurse Practitioner

## 2016-02-21 MED ORDER — NITROFURANTOIN MONOHYD MACRO 100 MG PO CAPS: ORAL_CAPSULE | ORAL | Status: DC

## 2016-02-21 NOTE — Telephone Encounter (Signed)
Return call to Stephanie. °

## 2016-02-21 NOTE — Telephone Encounter (Signed)
The urine results are now back and the RX for Macrobid was sent in to use PC.

## 2016-02-21 NOTE — Telephone Encounter (Signed)
Notified of urine culture results.  Patient is agreeable with plan.

## 2016-06-15 ENCOUNTER — Other Ambulatory Visit: Payer: Self-pay | Admitting: Nurse Practitioner

## 2016-06-15 DIAGNOSIS — Z1231 Encounter for screening mammogram for malignant neoplasm of breast: Secondary | ICD-10-CM

## 2016-06-26 ENCOUNTER — Ambulatory Visit
Admission: RE | Admit: 2016-06-26 | Discharge: 2016-06-26 | Disposition: A | Discharge status: Home / Self Care | Payer: Managed Care, Other (non HMO) | Source: Ambulatory Visit | Attending: Nurse Practitioner | Admitting: Nurse Practitioner

## 2016-06-26 DIAGNOSIS — Z1231 Encounter for screening mammogram for malignant neoplasm of breast: Secondary | ICD-10-CM

## 2016-09-12 ENCOUNTER — Ambulatory Visit (INDEPENDENT_AMBULATORY_CARE_PROVIDER_SITE_OTHER): Payer: Managed Care, Other (non HMO) | Admitting: Nurse Practitioner

## 2016-09-12 ENCOUNTER — Encounter: Payer: Self-pay | Admitting: Nurse Practitioner

## 2016-09-12 VITALS — BP 126/84 | HR 56 | Ht 65.0 in | Wt 151.0 lb

## 2016-09-12 DIAGNOSIS — Z0000 Encounter for general adult medical examination without abnormal findings: Secondary | ICD-10-CM | POA: Diagnosis not present

## 2016-09-12 DIAGNOSIS — Z803 Family history of malignant neoplasm of breast: Secondary | ICD-10-CM | POA: Diagnosis not present

## 2016-09-12 DIAGNOSIS — R300 Dysuria: Secondary | ICD-10-CM | POA: Diagnosis not present

## 2016-09-12 DIAGNOSIS — Z01419 Encounter for gynecological examination (general) (routine) without abnormal findings: Secondary | ICD-10-CM

## 2016-09-12 LAB — POCT URINALYSIS DIPSTICK
Bilirubin, UA: NEGATIVE
Blood, UA: NEGATIVE
GLUCOSE UA: NEGATIVE
Ketones, UA: NEGATIVE
NITRITE UA: NEGATIVE
PROTEIN UA: NEGATIVE
Urobilinogen, UA: NEGATIVE
pH, UA: 5

## 2016-09-12 MED ORDER — NITROFURANTOIN MONOHYD MACRO 100 MG PO CAPS: ORAL_CAPSULE | ORAL | 12 refills | Status: DC

## 2016-09-12 NOTE — Progress Notes (Signed)
Patient ID: Rebecca Browning, female   DOB: 1966/07/23, 50 y.o.   MRN: [id number]  50 y.o. [id number] Married  Caucasian Fe here for annual exam.  She feels well.  Since UTI problems in June she has been on [facility] and has done well.  About 3 weeks ago she had slight increase in dysuria and called the Tele doctor and was told to increase Macrobid to BID X 3 days. Symptoms seemed to go away until now.  Maybe slight dysuria past 1 day.  Denies fever, chills, flank pain.  Patient's last menstrual period was 01/03/2010 (exact date).          Sexually active: Yes.    The current method of family planning is post menopausal status.    Exercising: Yes.    cardio and weights Smoker:  no  Health Maintenance: Pap: 09/08/15, Negative with neg HR HPV MMG: 06/26/16,  Bi-Rads 1 Negative Colonoscopy: 09/15/14, Normal, repeat in 10 years BMD: Never TDaP: 08/10/08 Hep C and HIV: 09/08/15 Labs: PCP takes care of all labs  Urine: 1+ leuk's (dysuria)   reports that she has never smoked. She has never used smokeless tobacco. She reports that she drinks alcohol. She reports that she does not use drugs.  No past medical history on file.  Past Surgical History:  Procedure Laterality Date  . DILATION AND CURETTAGE OF UTERUS     SAB  . LIPOSUCTION Bilateral 08/19/14   thighs  . POPLITEAL SYNOVIAL CYST EXCISION  1970's    Current Outpatient Prescriptions  Medication Sig Dispense Refill  . ciprofloxacin (CIPRO) 500 MG tablet Take 1 tablet (500 mg total) by mouth 2 (two) times daily. 14 tablet 0  . fluconazole (DIFLUCAN) 150 MG tablet Take 1 tablet (150 mg total) by mouth once. Take one tablet.  Repeat in 48 hours if symptoms are not completely resolved. 2 tablet 1  . Multiple Vitamin (MULTIVITAMIN) tablet Take 1 tablet by mouth daily. [employer] Multipack    . nitrofurantoin, macrocrystal-monohydrate, (MACROBID) 100 MG capsule 1 cap PC (post coital) prn 30 capsule 0  . phenazopyridine (PYRIDIUM) 200 MG tablet Take 1  tablet (200 mg total) by mouth 3 (three) times daily as needed for pain. 20 tablet 0  . zolpidem (AMBIEN) 10 MG tablet Take 1 tablet by mouth at bedtime as needed.  3   No current facility-administered medications for this visit.     Family History  Problem Relation Age of Onset  . Breast cancer Sister 77  . Hypertension Mother   . Breast cancer Mother 96    lumpectomy, radiation  . Heart attack Father   . Heart disease Father     CABG  . Hypertension Father   . Hyperlipidemia Father     ROS:  Pertinent items are noted in HPI.  Otherwise, a comprehensive ROS was negative.  Exam:   LMP 01/03/2010 (Exact Date)    Ht Readings from Last 3 Encounters:  02/16/16 _0  (1.651 m)  09/08/15 5' 4.75" (1.645 m)  08/31/14 5' 5.75" (1.67 m)    General appearance: alert, cooperative and appears stated age Head: Normocephalic, without obvious abnormality, atraumatic Neck: no adenopathy, supple, symmetrical, trachea midline and thyroid normal to inspection and palpation Lungs: clear to auscultation bilaterally Breasts: normal appearance, no masses or tenderness Heart: regular rate and rhythm Abdomen: soft, non-tender; no masses,  no organomegaly, no flank pain Extremities: extremities normal, atraumatic, no cyanosis or edema Skin: Skin color, texture, turgor normal. No rashes or  lesions Lymph nodes: Cervical, supraclavicular, and axillary nodes normal. No abnormal inguinal nodes palpated Neurologic: Grossly normal   Pelvic: External genitalia:  no lesions              Urethra:  normal appearing urethra with no masses, tenderness or lesions              Bartholin's and Skene's: normal                 Vagina: normal appearing vagina with normal color and discharge, no lesions              Cervix: anteverted              Pap taken: No. Bimanual Exam:  Uterus:  normal size, contour, position, consistency, mobility, non-tender              Adnexa: no mass, fullness, tenderness                Rectovaginal: Confirms               Anus:  normal sphincter tone, no lesions  Chaperone present: yes  A:  Well Woman with normal exam  Postmenopausal no HRT [place] of mother & sister with breast cancer - no BRCA testing - pt aware that she can have testing done and will get consult for genetics - she wants to have done as she has daughters            R/O UTI  History of PC UTI - does well on Macrobid prn    P:   Reviewed health and wellness pertinent to exam  Pap smear as above  Mammogram is due 06/2017  Will make a referral to genetics  Follow with urine culture and micro  Refill Macrobid prn counseled on breast self exam, mammography screening, adequate intake of calcium and vitamin D, diet and exercise, Kegel's exercises return annually or prn  An After Visit Summary was printed and given to the patient.

## 2016-09-12 NOTE — Patient Instructions (Addendum)

## 2016-09-13 LAB — URINALYSIS, MICROSCOPIC ONLY
BACTERIA UA: NONE SEEN [HPF]
Casts: NONE SEEN [LPF]
Crystals: NONE SEEN [HPF]
RBC / HPF: NONE SEEN RBC/HPF (ref <=2)
SQUAMOUS EPITHELIAL / LPF: NONE SEEN [HPF] (ref <=5)
Yeast: NONE SEEN [HPF]

## 2016-09-13 LAB — URINE CULTURE: ORGANISM ID, BACTERIA: NO GROWTH

## 2016-09-14 NOTE — Progress Notes (Signed)
Encounter reviewed by Dr. Brook Amundson C. Silva.  

## 2017-04-12 ENCOUNTER — Other Ambulatory Visit: Payer: Self-pay | Admitting: Nurse Practitioner

## 2017-04-12 DIAGNOSIS — Z1231 Encounter for screening mammogram for malignant neoplasm of breast: Secondary | ICD-10-CM

## 2017-04-30 ENCOUNTER — Telehealth: Payer: Self-pay | Admitting: Obstetrics & Gynecology

## 2017-04-30 NOTE — Telephone Encounter (Signed)
LMTCB/:NP/ .CX/LETTER SENT/RD ° °

## 2017-06-28 ENCOUNTER — Ambulatory Visit
Admission: RE | Admit: 2017-06-28 | Discharge: 2017-06-28 | Disposition: A | Discharge status: Home / Self Care | Payer: Managed Care, Other (non HMO) | Source: Ambulatory Visit | Attending: Nurse Practitioner | Admitting: Nurse Practitioner

## 2017-06-28 DIAGNOSIS — Z1231 Encounter for screening mammogram for malignant neoplasm of breast: Secondary | ICD-10-CM

## 2017-09-14 ENCOUNTER — Ambulatory Visit: Payer: Managed Care, Other (non HMO) | Admitting: Nurse Practitioner

## 2017-09-14 ENCOUNTER — Ambulatory Visit (INDEPENDENT_AMBULATORY_CARE_PROVIDER_SITE_OTHER): Payer: 59 | Admitting: Certified Nurse Midwife

## 2017-09-14 ENCOUNTER — Other Ambulatory Visit: Payer: Self-pay

## 2017-09-14 ENCOUNTER — Encounter: Payer: Self-pay | Admitting: Certified Nurse Midwife

## 2017-09-14 ENCOUNTER — Other Ambulatory Visit (HOSPITAL_COMMUNITY)
Admission: RE | Admit: 2017-09-14 | Discharge: 2017-09-14 | Disposition: A | Discharge status: Home / Self Care | Payer: 59 | Source: Ambulatory Visit | Attending: Certified Nurse Midwife | Admitting: Certified Nurse Midwife

## 2017-09-14 VITALS — BP 114/70 | HR 58 | Resp 14 | Ht 64.5 in | Wt 154.0 lb

## 2017-09-14 DIAGNOSIS — Z23 Encounter for immunization: Secondary | ICD-10-CM

## 2017-09-14 DIAGNOSIS — Z803 Family history of malignant neoplasm of breast: Secondary | ICD-10-CM | POA: Diagnosis not present

## 2017-09-14 DIAGNOSIS — N951 Menopausal and female climacteric states: Secondary | ICD-10-CM

## 2017-09-14 DIAGNOSIS — Z124 Encounter for screening for malignant neoplasm of cervix: Secondary | ICD-10-CM | POA: Insufficient documentation

## 2017-09-14 DIAGNOSIS — Z8249 Family history of ischemic heart disease and other diseases of the circulatory system: Secondary | ICD-10-CM | POA: Insufficient documentation

## 2017-09-14 DIAGNOSIS — N390 Urinary tract infection, site not specified: Secondary | ICD-10-CM

## 2017-09-14 DIAGNOSIS — Z01419 Encounter for gynecological examination (general) (routine) without abnormal findings: Secondary | ICD-10-CM | POA: Diagnosis not present

## 2017-09-14 DIAGNOSIS — Z1151 Encounter for screening for human papillomavirus (HPV): Secondary | ICD-10-CM | POA: Insufficient documentation

## 2017-09-14 NOTE — Progress Notes (Signed)
51 y.o. [id number] Married  Caucasian Fe here for annual exam. Menopausal no HRT. Denies vaginal bleeding or vaginal dryness.  Sees PCP  Dr. Jarold MottoPatterson for aex, labs, all normal. Staying active with exercise and tennis. Macrobid working well for post coital UTI, desires continuance. Usually use only one to resolve issues. Daughters doing well and patients here. No health issues today. Taking [place] on trip for [place] to [place]  Patient's last menstrual period was 01/03/2010 (exact date).          Sexually active: Yes.    The current method of family planning is post menopausal status.    Exercising: Yes.    cardio, weights, yoga  Smoker:  no  Health Maintenance: Pap:  09-08-15 neg HPV HR neg History of Abnormal Pap: no MMG:  06-28-17 category b density birads 1:neg Self Breast exams: yes Colonoscopy:  2015 f/u 2376yrs BMD:   none TDaP:  2009 Shingles: negative Pneumonia: negative  Hep C and HIV: both neg 2016 Labs: PCP   reports that  has never smoked. she has never used smokeless tobacco. She reports that she drinks alcohol. She reports that she does not use drugs.  History reviewed. No pertinent past medical history.  Past Surgical History:  Procedure Laterality Date  . DILATION AND CURETTAGE OF UTERUS     SAB  . LIPOSUCTION Bilateral 08/19/14   thighs  . POPLITEAL SYNOVIAL CYST EXCISION  1970's    Current Outpatient Medications  Medication Sig Dispense Refill  . Calcium Carbonate-Vitamin D3 (CALCIUM 600/VITAMIN D) 600-400 MG-UNIT TABS Take 1 tablet by mouth daily.    . Multiple Vitamin (MULTIVITAMIN) tablet Take 1 tablet by mouth daily. [employer]NC Multipack    . nitrofurantoin, macrocrystal-monohydrate, (MACROBID) 100 MG capsule 1 cap PC (post coital) prn 30 capsule 12  . Omega-3 Fatty Acids (FISH OIL PO) Take by mouth.    . phenazopyridine (PYRIDIUM) 200 MG tablet Take 1 tablet (200 mg total) by mouth 3 (three) times daily as needed for pain. 20 tablet 0   No current  facility-administered medications for this visit.     Family History  Problem Relation Age of Onset  . Breast cancer Sister [id number]       mastectomy no chemo/ radiation  . Hypertension Mother   . Breast cancer Mother 1978       lumpectomy, radiation  . Heart attack Father   . Heart disease Father        CABG  . Hypertension Father   . Hyperlipidemia Father   . Cancer Father 3170       prostate  . Hyperlipidemia Brother   . Heart disease Maternal Uncle   . Hyperlipidemia Maternal Grandfather   . Heart disease Maternal Grandfather   . Hyperlipidemia Paternal Grandfather   . Heart disease Paternal Grandfather     ROS:  Pertinent items are noted in HPI.  Otherwise, a comprehensive ROS was negative.  Exam:   BP 114/70 (BP Location: Right Arm, Patient Position: Sitting, Cuff Size: Normal)   Pulse (!) 58   Resp 14   Ht 5' 4.5" (1.638 m)   Wt 154 lb (69.9 kg)   LMP 01/03/2010 (Exact Date)   BMI 26.03 kg/m  Height: 5' 4.5" (163.8 cm) Ht Readings from Last 3 Encounters:  09/14/17 5' 4.5" (1.638 m)  09/12/16 5\' 5"  (1.651 m)  02/16/16 5\' 5"  (1.651 m)    General appearance: alert, cooperative and appears stated age Head: Normocephalic, without obvious abnormality, atraumatic Neck: no  adenopathy, supple, symmetrical, trachea midline and thyroid normal to inspection and palpation Lungs: clear to auscultation bilaterally Breasts: normal appearance, no masses or tenderness, No nipple retraction or dimpling, No nipple discharge or bleeding, No axillary or supraclavicular adenopathy Heart: regular rate and rhythm Abdomen: soft, non-tender; no masses,  no organomegaly Extremities: extremities normal, atraumatic, no cyanosis or edema Skin: Skin color, texture, turgor normal. No rashes or lesions Lymph nodes: Cervical, supraclavicular, and axillary nodes normal. No abnormal inguinal nodes palpated Neurologic: Grossly normal   Pelvic: External genitalia:  no lesions              Urethra:   normal appearing urethra with no masses, tenderness or lesions              Bartholin's and Skene's: normal                 Vagina: normal appearing vagina with normal color and discharge, no lesions              Cervix: multiparous appearance, no bleeding following Pap and no lesions              Pap taken: Yes.   Bimanual Exam:  Uterus:  normal size, contour, position, consistency, mobility, non-tender              Adnexa: normal adnexa and no mass, fullness, tenderness               Rectovaginal: Confirms               Anus:  normal sphincter tone, no lesions  Chaperone present: yes  A:  Well Woman with normal exam  Menopausal no HRT  Post Coital UTI Macrobid/Pyridium use working well, desires refill   Family history of breast cancer, mother 70,sister 4540, no BRACA screening done  Immunization due  P:   Reviewed health and wellness pertinent to exam  Aware of need to evaluate if vaginal bleeding  Reviewed Macrobid and Pyridium use post coital.  Rx Macrobid see order with instructions  Rx Pyridium see order with instructions  Discussed genetic counseling or evaluation due to breast cancer history and patient has 3 daughters. Patient will consider and advise if she would like to schedule or have done. Daughters have increased risk with PA who had breast cancer also. Strongly encouraged. Questions addressed.  Continue follow up with MD for aex/labs  Requests TDAP  Pap smear: yes   counseled on breast self exam, mammography screening, feminine hygiene, adequate intake of calcium and vitamin D, diet and exercise  return annually or prn  An After Visit Summary was printed and given to the patient.

## 2017-09-14 NOTE — Patient Instructions (Signed)

## 2017-09-15 MED ORDER — NITROFURANTOIN MONOHYD MACRO 100 MG PO CAPS
ORAL_CAPSULE | ORAL | 12 refills | Status: DC
Start: 2017-09-15 — End: 2018-02-21

## 2017-09-15 MED ORDER — PHENAZOPYRIDINE HCL 200 MG PO TABS: 200.0000 mg | ORAL_TABLET | Freq: Three times a day (TID) | ORAL | 1 refills | Status: DC | PRN

## 2017-09-17 LAB — CYTOLOGY - PAP
Diagnosis: NEGATIVE
HPV (WINDOPATH): NOT DETECTED

## 2018-01-22 ENCOUNTER — Ambulatory Visit (INDEPENDENT_AMBULATORY_CARE_PROVIDER_SITE_OTHER): Payer: 59 | Admitting: Sports Medicine

## 2018-01-22 ENCOUNTER — Encounter: Payer: Self-pay | Admitting: Sports Medicine

## 2018-01-22 VITALS — BP 110/70 | Ht 65.0 in | Wt 150.0 lb

## 2018-01-22 DIAGNOSIS — M7711 Lateral epicondylitis, right elbow: Secondary | ICD-10-CM | POA: Diagnosis not present

## 2018-01-23 NOTE — Progress Notes (Signed)
   Subjective:    Patient ID: Rebecca Browning, female    DOB: 1966-01-21, 52 y.o.   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 Prior to her elbow pain, she had increased her playing time quite a bit but she denies changing racquet tension or grip. She also denies changing her tennis strokes. She has taken a little bit of time off which has helped. She also purchased a counter-force brace but it sounds like she has been wearing it incorrectly. She denies numbness or tingling. No prior occurrences. She has been taking intermittent doses of NSAIDs which have been helpful. No imaging has been done to date. No prior elbow surgeries.   Past medical history reviewed Medications reviewed Allergies reviewed   Review of Systems    as above Objective:   Physical Exam  Well-developed, well-nourished. No acute distress. Awake alert and oriented 3. Vital signs reviewed  Right elbow: Full range of motion. No effusion. No obvious soft tissue swelling. Slight tenderness to palpation over the lateral epicondyle. No tenderness to palpation at the insertion of the triceps tendon. Patient has reproducible pain with ECRB testing. Good pulses. No tenderness to palpation along the medial elbow. Good grip strength.  MSK ultrasound of the right elbow was performed. Limited images were obtained. There is a tiny spur off of the lateral epicondyle and some hypoechoic change to the deep portion of the tendon. Findings consistent with common extensor tendon tendinopathy and possible partial tearing.      Assessment & Plan:   Right elbow pain secondary to lateral  epicondylitis  Physical therapy for modalities and treatment to include eccentric strengthening exercises. The patient and physical therapist will also discuss merits of dry needling. I recommended that she avoid tennis until follow-up with me in 4 weeks. I educated her on the proper way to wear her counter-force brace She may continue with over-the-counter NSAIDs as needed. If symptoms persist a follow-up, consider topical nitroglycerin patch. Call with questions or concerns prior to her follow-up visit.

## 2018-01-28 ENCOUNTER — Other Ambulatory Visit: Payer: Self-pay

## 2018-01-28 ENCOUNTER — Ambulatory Visit: Payer: 59 | Attending: Sports Medicine | Admitting: Physical Therapy

## 2018-01-28 ENCOUNTER — Encounter: Payer: Self-pay | Admitting: Physical Therapy

## 2018-01-28 DIAGNOSIS — M25521 Pain in right elbow: Secondary | ICD-10-CM

## 2018-01-28 DIAGNOSIS — M6281 Muscle weakness (generalized): Secondary | ICD-10-CM | POA: Diagnosis present

## 2018-01-28 DIAGNOSIS — M62838 Other muscle spasm: Secondary | ICD-10-CM

## 2018-01-28 NOTE — Patient Instructions (Signed)
Access Code: [email or web address]  URL: [email or web address]  Date: 01/28/2018  Prepared by: Dorie Rank   Exercises  Standing Wrist Flexion Stretch - 3 reps - 1 sets - 30 sec hold - 3x daily - 7x weekly  Trigger Point Dry Needling  . What is Trigger Point Dry Needling (DN)? o DN is a physical therapy technique used to treat muscle pain and dysfunction. Specifically, DN helps deactivate muscle trigger points (muscle knots).  o A thin filiform needle is used to penetrate the skin and stimulate the underlying trigger point. The goal is for a local twitch response (LTR) to occur and for the trigger point to relax. No medication of any kind is injected during the procedure.   . What Does Trigger Point Dry Needling Feel Like?  o The procedure feels different for each individual patient. Some patients report that they do not actually feel the needle enter the skin and overall the process is not painful. Very mild bleeding may occur. However, many patients feel a deep cramping in the muscle in which the needle was inserted. This is the local twitch response.   Marland Kitchen How Will I feel after the treatment? o Soreness is normal, and the onset of soreness may not occur for a few hours. Typically this soreness does not last longer than two days.  o Bruising is uncommon, however; ice can be used to decrease any possible bruising.  o In rare cases feeling tired or nauseous after the treatment is normal. In addition, your symptoms may get worse before they get better, this period will typically not last longer than 24 hours.   . What Can I do After My Treatment? o Increase your hydration by drinking more water for the next 24 hours. o You may place ice or heat on the areas treated that have become sore, however, do not use heat on inflamed or bruised areas. Heat often brings more relief post needling. o You can continue your regular activities, but vigorous activity is not recommended initially after  the treatment for 24 hours. o DN is best combined with other physical therapy such as strengthening, stretching, and other therapies.    [facility] [address] [place], [place] [address] Phone # [phone] Fax [phone]

## 2018-01-28 NOTE — Therapy (Signed)
[facility] [address] [place], [place], [address] Phone: [phone]   Fax:  [phone]  Physical Therapy Evaluation  Patient Details  Name: Rebecca Browning MRN: [id number] Date of Birth: Nov 29, 1965 Referring Provider: Ralene Cork   Encounter Date: 01/28/2018  PT End of Session - 01/28/18 0946    Visit Number  1    Date for PT Re-Evaluation  03/11/18    Authorization Type  Aetna    Authorization - Visit Number  1    Authorization - Number of Visits  60    PT Start Time  0847    PT Stop Time  0928    PT Time Calculation (min)  41 min    Activity Tolerance  Patient tolerated treatment well    Behavior During Therapy  [facility] for tasks assessed/performed       History reviewed. No pertinent past medical history.  Past Surgical History:  Procedure Laterality Date  . DILATION AND CURETTAGE OF UTERUS     SAB  . LIPOSUCTION Bilateral 08/19/14   thighs  . POPLITEAL SYNOVIAL CYST EXCISION  1970's    There were no vitals filed for this visit.   Subjective Assessment - 01/28/18 0851    Subjective  Pt is a tennis player.  She is not currently playing tennis since having [place] last week which showed a partial tear.  she started playing more tennis.  Normally plays 2-3 days/week.  Pain was up to 4/10 during tennis with the use of brace, then 5/10 after playing and removing brace.  Hitting the ball forehand caused increased pain.  Not using much ice/heat.  Pain lasted about 10 minutes after playing tennis.    Limitations  Lifting;House hold activities exercises - tennis    Patient Stated Goals  get rid of pain    Currently in Pain?  No/denies         [facility] PT Assessment - 01/28/18 0001      Assessment   Medical Diagnosis  M77.11 (ICD-10-CM) - Right lateral epicondylitis    Referring Provider  Reino Bellis R    Onset Date/Surgical Date  12/12/17 approximate    Hand Dominance  Right    Prior Therapy  No       Precautions   Precautions  None      Restrictions   Weight Bearing Restrictions  No      Balance Screen   Has the patient fallen in the past 6 months  No      Home Environment   Living Environment  Private residence    Living Arrangements  Spouse/significant other;Children      Prior Function   Level of Independence  Independent    Vocation  Part time employment    Industrial/product designer      Cognition   Overall Cognitive Status  Within Functional Limits for tasks assessed      Observation/Other Assessments   Focus on Therapeutic Outcomes (FOTO)   40% limited      Posture/Postural Control   Posture/Postural Control  Postural limitations    Postural Limitations  Rounded Shoulders      ROM / Strength   AROM / PROM / Strength  AROM;Strength      AROM   Overall AROM Comments  WFL pain with wrist extension      Strength   Overall Strength Comments  Left grip-65 lb; Right grip 58lb; right wrist ext 4+/5  Palpation   Palpation comment  trigger points and tenderness along right wrist extensors      Ambulation/Gait   Gait Pattern  Within Functional Limits                Objective measurements completed on examination: See above findings.      [facility] Adult PT Treatment/Exercise - 01/28/18 0001      Exercises   Exercises  Wrist      Wrist Exercises   Wrist Extension  Self ROM;5 reps;Right stretch 20 sec hold x 3      Manual Therapy   Manual Therapy  Soft tissue mobilization    Soft tissue mobilization  right wrist extensors       Trigger Point Dry Needling - 01/28/18 1146    Consent Given?  Yes    Education Handout Provided  Yes    Muscles Treated Upper Body  -- right wrist extensors           PT Education - 01/28/18 0925    Education provided  Yes    Education Details  [email or web address] access code and dry needling info    Person(s) Educated  Patient    Methods  Explanation;Demonstration;Handout;Verbal cues    Comprehension  Verbalized  understanding;Returned demonstration          PT Long Term Goals - 01/28/18 1120      PT LONG TERM GOAL #1   Title  ind with advanced HEP    Time  6    Period  Weeks    Status  New    Target Date  03/11/18      PT LONG TERM GOAL #2   Title  pt will have no palpable trigger points in right forearm due to improved soft tissue length    Time  6    Period  Weeks    Status  New    Target Date  03/11/18      PT LONG TERM GOAL #3   Title  FOTO score of 27% or less limitation    Time  6    Period  Weeks    Status  New    Target Date  03/11/18      PT LONG TERM GOAL #4   Title  grip strength of right hand >65 lb with no pain    Time  6    Period  Weeks    Status  New    Target Date  03/11/18      PT LONG TERM GOAL #5   Title  pt will be able to return to tennis at least 1x/week without increased pain    Time  6    Period  Weeks    Status  New    Target Date  03/11/18             Plan - 01/28/18 0950    Clinical Impression Statement  Pt presents to clinic due to right lateral epicondylitis that began after increased frequency of playing tennis.  Pt has played tennis for most of her adult life.  She has never had this issue before and onset was a month and a half ago.  Per patient's chart there may be mild tearing and there is a small spur and inflammation around lateral epicondyle.  Pt has weakness and pain with right grip.  Right wrist extension is sligtly weaker than the left with increased pain.  Pt has several palpable trigger points that were palpated  manually as well as with eliciting twitch response with needling.  These trigger points were found along right wrist extensors . Pt demonstrates postural deficits including rounded shoulders.  Pt will benefit from skiled PT to address impairments and return to functional and recreational activiites.    History and Personal Factors relevant to plan of care:  motivated, acute symptoms    Clinical Presentation  Evolving     Clinical Presentation due to:  pain has been getting worse    Clinical Decision Making  Low    Rehab Potential  Excellent    PT Frequency  2x / week reduce to 1x/week as able    PT Duration  6 weeks    PT Treatment/Interventions  ADLs/Self Care Home Management;Biofeedback;Cryotherapy;Electrical Stimulation;Moist Heat;Iontophoresis /ml Dexamethasone;Ultrasound;Therapeutic activities;Therapeutic exercise;Neuromuscular re-education;Patient/family education;Manual techniques;Passive range of motion;Dry needling;Taping    PT Next Visit Plan  f/u on dry needling to right wrist extensors; stretches and eccentric strengthening    Consulted and Agree with Plan of Care  Patient       Patient will benefit from skilled therapeutic intervention in order to improve the following deficits and impairments:  Increased muscle spasms, Pain, Decreased strength  Visit Diagnosis: Pain in right elbow - Plan: PT plan of care cert/re-cert  Muscle weakness (generalized) - Plan: PT plan of care cert/re-cert  Other muscle spasm - Plan: PT plan of care cert/re-cert     Problem List There are no active problems to display for this patient.   Vincente Poli, PT 01/28/2018, 11:50 AM  [facility] [address] [place], [place], [address] Phone: [phone]   Fax:  [phone]  Name: Rebecca Browning MRN: [id number] Date of Birth: 12/31/1965

## 2018-01-31 ENCOUNTER — Ambulatory Visit: Payer: 59 | Admitting: Physical Therapy

## 2018-01-31 ENCOUNTER — Ambulatory Visit: Payer: 59 | Attending: Sports Medicine

## 2018-01-31 DIAGNOSIS — M6281 Muscle weakness (generalized): Secondary | ICD-10-CM | POA: Insufficient documentation

## 2018-01-31 DIAGNOSIS — M25521 Pain in right elbow: Secondary | ICD-10-CM | POA: Diagnosis not present

## 2018-01-31 DIAGNOSIS — M62838 Other muscle spasm: Secondary | ICD-10-CM | POA: Diagnosis present

## 2018-01-31 NOTE — Therapy (Addendum)
[facility] [address], [address] [place], [place], [address] Phone: [phone]   Fax:  [phone]  Physical Therapy Treatment  Patient Details  Name: Rebecca Browning MRN: [id number] Date of Birth: 06/21/1966 Referring Provider: Ralene Cork   Encounter Date: 01/31/2018  PT End of Session - 01/31/18 1100    Visit Number  2    Date for PT Re-Evaluation  03/11/18    Authorization Type  Aetna    Authorization - Visit Number  2    Authorization - Number of Visits  60    PT Start Time  1017    PT Stop Time  1053    PT Time Calculation (min)  36 min    Activity Tolerance  Patient tolerated treatment well    Behavior During Therapy  [facility] for tasks assessed/performed       History reviewed. No pertinent past medical history.  Past Surgical History:  Procedure Laterality Date  . DILATION AND CURETTAGE OF UTERUS     SAB  . LIPOSUCTION Bilateral 08/19/14   thighs  . POPLITEAL SYNOVIAL CYST EXCISION  1970's    There were no vitals filed for this visit.                    [facility] Adult PT Treatment/Exercise - 01/31/18 0001      Exercises   Exercises  Wrist;Hand      Modalities   Modalities  Iontophoresis      Iontophoresis   Type of Iontophoresis  Dexamethasone    Location  Rt elbow at distal triceps/proximal wrist extensor tendons    Dose  1.0 cc lot [id number]    Time  6 hour wear time      Manual Therapy   Manual Therapy  Soft tissue mobilization    Soft tissue mobilization  right wrist extensors and distal triceps       Trigger Point Dry Needling - 01/31/18 1055    Consent Given?  Yes    Muscles Treated Upper Body  -- Rt distal triceps and proximal wrist extensors           PT Education - 01/31/18 1047    Education provided  Yes    Education Details  ionto education    Person(s) Educated  Patient    Methods  Explanation;Handout    Comprehension  Verbalized understanding           PT Long Term Goals - 01/28/18 1120      PT LONG TERM GOAL #1   Title  ind with advanced HEP    Time  6    Period  Weeks    Status  New    Target Date  03/11/18      PT LONG TERM GOAL #2   Title  pt will have no palpable trigger points in right forearm due to improved soft tissue length    Time  6    Period  Weeks    Status  New    Target Date  03/11/18      PT LONG TERM GOAL #3   Title  FOTO score of 27% or less limitation    Time  6    Period  Weeks    Status  New    Target Date  03/11/18      PT LONG TERM GOAL #4   Title  grip strength of right hand >65 lb with no pain  Time  6    Period  Weeks    Status  New    Target Date  03/11/18      PT LONG TERM GOAL #5   Title  pt will be able to return to tennis at least 1x/week without increased pain    Time  6    Period  Weeks    Status  New    Target Date  03/11/18        Plan:     Pt with trigger points and tension in Rt distal triceps insertion and proximal wrist extensors.  Pt with reduced tension after dry needling today.  Pt is stretching frequently.  Pt has not returned to playing tennis yet.  Pt will continue to benefit from skilled PT for flexibility, gentle strength and manual to address trigger points.     PT Treatment/Interventions  ADLs/Self Care Home Management;Biofeedback;Cryotherapy;Electrical Stimulation;Moist Heat;Iontophoresis /ml Dexamethasone;Ultrasound;Therapeutic activities;Therapeutic exercise;Neuromuscular re-education;Patient/family education;Manual techniques;Passive range of motion;Dry needling;Taping    PT Next Visit Plan  Assess response to dry needling.  Begin gentle strength for Rt elbow, manual and flexibility.     Consulted and Agree with Plan of Care  Patient            Patient will benefit from skilled therapeutic intervention in order to improve the following deficits and impairments:     Visit Diagnosis: Pain in right elbow  Muscle weakness  (generalized)  Other muscle spasm     Problem List There are no active problems to display for this patient.   Lorrene Reid, PT 01/31/18 11:06 AM  [facility] [address], [address] [place], [place], [address] Phone: [phone]   Fax:  [phone]  Name: Rebecca Browning MRN: [id number] Date of Birth: Dec 03, 1965

## 2018-01-31 NOTE — Patient Instructions (Addendum)

## 2018-02-04 ENCOUNTER — Encounter: Payer: Self-pay | Admitting: Physical Therapy

## 2018-02-04 ENCOUNTER — Ambulatory Visit: Payer: 59 | Admitting: Physical Therapy

## 2018-02-04 DIAGNOSIS — M62838 Other muscle spasm: Secondary | ICD-10-CM

## 2018-02-04 DIAGNOSIS — M25521 Pain in right elbow: Secondary | ICD-10-CM | POA: Diagnosis not present

## 2018-02-04 DIAGNOSIS — M6281 Muscle weakness (generalized): Secondary | ICD-10-CM

## 2018-02-04 NOTE — Therapy (Signed)
[facility] [address], [address] [place], [place], [address] Phone: [phone]   Fax:  [phone]  Physical Therapy Treatment  Patient Details  Name: Rebecca Browning MRN: [id number] Date of Birth: 1966/05/02 Referring Provider: Ralene Cork   Encounter Date: 02/04/2018  PT End of Session - 02/04/18 0805    Visit Number  3    Date for PT Re-Evaluation  03/11/18    Authorization Type  Aetna    Authorization - Visit Number  3    Authorization - Number of Visits  60    PT Start Time  0806    PT Stop Time  0844    PT Time Calculation (min)  38 min    Activity Tolerance  Patient tolerated treatment well    Behavior During Therapy  [facility] for tasks assessed/performed       History reviewed. No pertinent past medical history.  Past Surgical History:  Procedure Laterality Date  . DILATION AND CURETTAGE OF UTERUS     SAB  . LIPOSUCTION Bilateral 08/19/14   thighs  . POPLITEAL SYNOVIAL CYST EXCISION  1970's    There were no vitals filed for this visit.  Subjective Assessment - 02/04/18 0819    Subjective  Pt is not having pain currently.  She states she had some twinges when lifting heavy things.    Patient Stated Goals  get rid of pain                       [facility] Adult PT Treatment/Exercise - 02/04/18 0001      Elbow Exercises   Elbow Extension  Strengthening;Both;Standing;Theraband;10 reps 3 sets; slow eccentric control    Theraband Level (Elbow Extension)  Level 4 (Blue)    Wrist Flexion  Strengthening;Right;20 reps;Seated 1lb, 2lb, red band - 20x each      Wrist Exercises   Other wrist exercises  wrist flexion and extension stretch 3x 30 sec each      Manual Therapy   Manual Therapy  Soft tissue mobilization    Soft tissue mobilization  right wrist extensors, flexors, supinator, and distal triceps             PT Education - 02/04/18 0830    Education provided  Yes    Education  Details  [email or web address] access    Person(s) Educated  Patient    Methods  Explanation;Demonstration;Handout    Comprehension  Verbalized understanding;Returned demonstration          PT Long Term Goals - 02/04/18 0908      PT LONG TERM GOAL #1   Title  ind with advanced HEP    Status  On-going      PT LONG TERM GOAL #2   Title  pt will have no palpable trigger points in right forearm due to improved soft tissue length    Status  On-going      PT LONG TERM GOAL #3   Title  FOTO score of 27% or less limitation    Status  On-going      PT LONG TERM GOAL #4   Title  grip strength of right hand >65 lb with no pain    Status  On-going      PT LONG TERM GOAL #5   Title  pt will be able to return to tennis at least 1x/week without increased pain    Status  On-going  Plan - 02/04/18 0855    Clinical Impression Statement  Patient has not returned to tennis at all, but has not had any increased pain.  She responded well from dry needling and still a little sore.  She didn't notice any differnce with ionto patch.  Pt was able to correclty perform eccentric strengthening exercises with cues for posture and alignment.  Pt had trigger points that were released with STM in wrist extensors.  She also had some muscle tightness in wrist flexors that loosened with soft tissue mobs and stretches.  She will benefit from skilled PT to continue working on improved soft tissue length and strength with focus on eccentric movements for tendon healing.    PT Treatment/Interventions  ADLs/Self Care Home Management;Biofeedback;Cryotherapy;Electrical Stimulation;Moist Heat;Iontophoresis /ml Dexamethasone;Ultrasound;Therapeutic activities;Therapeutic exercise;Neuromuscular re-education;Patient/family education;Manual techniques;Passive range of motion;Dry needling;Taping    PT Next Visit Plan  STM and dry needling to right wrist extensors, flexors, tricep; stretches and eccentric strengthening, f/u  on HEP    Consulted and Agree with Plan of Care  Patient       Patient will benefit from skilled therapeutic intervention in order to improve the following deficits and impairments:  Increased muscle spasms, Pain, Decreased strength  Visit Diagnosis: Pain in right elbow  Muscle weakness (generalized)  Other muscle spasm     Problem List There are no active problems to display for this patient.   Vincente Poli, PT 02/04/2018, 9:11 AM  [facility] [address], [address] [place], [place], [address] Phone: [phone]   Fax:  [phone]  Name: Rebecca Browning MRN: [id number] Date of Birth: 06/17/1966

## 2018-02-04 NOTE — Patient Instructions (Signed)
Access Code: [email or web address]  URL: [email or web address]  Date: 02/04/2018  Prepared by: Dorie Rank   Exercises  Standing Wrist Flexion Stretch - 3 reps - 1 sets - 30 sec hold - 3x daily - 7x weekly  Standing Wrist Extension Stretch - 3 reps - 1 sets - 30 sec hold - 1x daily - 7x weekly  Wrist Extension with Resistance - 10 reps - 3 sets - 1x daily - 7x weekly  Standing Forearm Pronation and Supination with Hammer - 10 reps - 3 sets - 1x daily - 7x weekly  Standing Tricep Extensions with Resistance - 10 reps - 3 sets - 1x daily - 7x weekly

## 2018-02-11 ENCOUNTER — Ambulatory Visit: Payer: 59 | Admitting: Physical Therapy

## 2018-02-11 DIAGNOSIS — M25521 Pain in right elbow: Secondary | ICD-10-CM | POA: Diagnosis not present

## 2018-02-11 DIAGNOSIS — M6281 Muscle weakness (generalized): Secondary | ICD-10-CM

## 2018-02-11 DIAGNOSIS — M62838 Other muscle spasm: Secondary | ICD-10-CM

## 2018-02-11 NOTE — Therapy (Signed)
[facility] [address] [place], [place], [address] Phone: [phone]   Fax:  [phone]  Physical Therapy Treatment  Patient Details  Name: Rebecca Browning MRN: [id number] Date of Birth: 08-10-1966 Referring Provider: Ralene Cork   Encounter Date: 02/11/2018  PT End of Session - 02/11/18 1022    Visit Number  4    Date for PT Re-Evaluation  03/11/18    Authorization Type  Aetna    Authorization - Visit Number  4    Authorization - Number of Visits  60    PT Start Time  1018    PT Stop Time  1058    PT Time Calculation (min)  40 min    Activity Tolerance  Patient tolerated treatment well       No past medical history on file.  Past Surgical History:  Procedure Laterality Date  . DILATION AND CURETTAGE OF UTERUS     SAB  . LIPOSUCTION Bilateral 08/19/14   thighs  . POPLITEAL SYNOVIAL CYST EXCISION  1970's    There were no vitals filed for this visit.  Subjective Assessment - 02/11/18 1232    Subjective  Pain is about the same, not very noticeable unless I aggravate it.      Limitations  Lifting;House hold activities    Patient Stated Goals  get rid of pain    Currently in Pain?  No/denies                       [facility] Adult PT Treatment/Exercise - 02/11/18 0001      Wrist Exercises   Wrist Flexion  Strengthening;Right;Seated;Bar weights/barbell;10 reps    Bar Weights/Barbell (Wrist Flexion)  4 lbs    Wrist Extension  Strengthening;Right;10 reps;Seated;Bar weights/barbell    Bar Weights/Barbell (Wrist Extension)  4 lbs    Other wrist exercises  supination with eccentric - 20x 4lb      Modalities   Modalities  Ultrasound      Ultrasound   Ultrasound Location  lateral epicondyle    Ultrasound Parameters  1.2 intensity at 20%, 3.3MHz    Ultrasound Goals  Pain      Manual Therapy   Soft tissue mobilization  right wrist extensors, flexors, supinator, and distal triceps        Trigger Point Dry Needling - 02/11/18 1233    Consent Given?  Yes    Muscles Treated Upper Body  -- proximal wrist flexors and extensors                PT Long Term Goals - 02/04/18 0908      PT LONG TERM GOAL #1   Title  ind with advanced HEP    Status  On-going      PT LONG TERM GOAL #2   Title  pt will have no palpable trigger points in right forearm due to improved soft tissue length    Status  On-going      PT LONG TERM GOAL #3   Title  FOTO score of 27% or less limitation    Status  On-going      PT LONG TERM GOAL #4   Title  grip strength of right hand >65 lb with no pain    Status  On-going      PT LONG TERM GOAL #5   Title  pt will be able to return to tennis at least 1x/week without increased pain  Status  On-going            Plan - 02/11/18 1229    Clinical Impression Statement  Pt states she is feeling good and no increased pain with exercises.  Pt had some twitch response with dry needling and muscle length increased as noted with palpation after teatment.  Pt responded well to [place] for reduced inflammation.  Pt needed cues for slow eccentric movements. Pt will benefit from skilled PT to progress strength as tolerated.     PT Treatment/Interventions  ADLs/Self Care Home Management;Biofeedback;Cryotherapy;Electrical Stimulation;Moist Heat;Iontophoresis /ml Dexamethasone;Ultrasound;Therapeutic activities;Therapeutic exercise;Neuromuscular re-education;Patient/family education;Manual techniques;Passive range of motion;Dry needling;Taping    PT Next Visit Plan  STM and dry needling to right wrist extensors, flexors, tricep; stretches and eccentric strengthening, f/u on response to [place] and increased restistance with exercises    Consulted and Agree with Plan of Care  Patient       Patient will benefit from skilled therapeutic intervention in order to improve the following deficits and impairments:  Increased muscle spasms, Pain, Decreased  strength  Visit Diagnosis: Pain in right elbow  Muscle weakness (generalized)  Other muscle spasm     Problem List There are no active problems to display for this patient.   Vincente Poli, PT 02/11/2018, 12:34 PM  [facility] [address] [place], [place], [address] Phone: [phone]   Fax:  [phone]  Name: Rebecca Browning MRN: [id number] Date of Birth: 1965-12-18

## 2018-02-18 ENCOUNTER — Ambulatory Visit: Payer: 59 | Admitting: Physical Therapy

## 2018-02-18 DIAGNOSIS — M6281 Muscle weakness (generalized): Secondary | ICD-10-CM

## 2018-02-18 DIAGNOSIS — M62838 Other muscle spasm: Secondary | ICD-10-CM

## 2018-02-18 DIAGNOSIS — M25521 Pain in right elbow: Secondary | ICD-10-CM

## 2018-02-18 NOTE — Patient Instructions (Signed)
Access Code: [email or web address]  URL: [email or web address]  Date: 02/18/2018  Prepared by: Dorie Rank   Exercises  Standing Wrist Flexion Stretch - 3 reps - 1 sets - 30 sec hold - 3x daily - 7x weekly  Standing Wrist Extension Stretch - 3 reps - 1 sets - 30 sec hold - 1x daily - 7x weekly  Wrist Extension with Resistance - 10 reps - 3 sets - 1x daily - 7x weekly  Standing Forearm Pronation and Supination with Hammer - 10 reps - 3 sets - 1x daily - 7x weekly  Standing Tricep Extensions with Resistance - 10 reps - 3 sets - 1x daily - 7x weekly  Shoulder Extension with Resistance - 10 reps - 3 sets - 1x daily - 7x weekly  Scapular Retraction with Resistance - 10 reps - 3 sets - 1x daily - 7x weekly

## 2018-02-18 NOTE — Therapy (Addendum)
[facility] [address], [place], [place], [address] Phone: [phone]   Fax:  [phone]  Physical Therapy Treatment  Patient Details  Name: Rebecca Browning MRN: [id number] Date of Birth: 1965/11/19 Referring Provider: Thurman Coyer   Encounter Date: 02/18/2018  PT End of Session - 02/18/18 0937    Visit Number  5    Date for PT Re-Evaluation  03/11/18    Authorization Type  [employer]    Authorization - Visit Number  5    Authorization - Number of Visits  60    PT Start Time  0933    PT Stop Time  1012    PT Time Calculation (min)  39 min    Activity Tolerance  Patient tolerated treatment well    Behavior During Therapy  [facility] for tasks assessed/performed       No past medical history on file.  Past Surgical History:  Procedure Laterality Date  . DILATION AND CURETTAGE OF UTERUS     SAB  . LIPOSUCTION Bilateral 08/19/14   thighs  . POPLITEAL SYNOVIAL CYST EXCISION  1970's    There were no vitals filed for this visit.  Subjective Assessment - 02/18/18 0935    Subjective  I have been haivng more pain the last few days.      Limitations  Lifting;House hold activities    Patient Stated Goals  get rid of pain    Currently in Pain?  No/denies                       [facility] Adult PT Treatment/Exercise - 02/18/18 0001      Elbow Exercises   Elbow Extension  Strengthening;Both;Standing;Theraband;10 reps    Theraband Level (Elbow Extension)  Level 4 (Blue)    Other elbow exercises  standing shoulder row and extension - blue band - 20x each      Wrist Exercises   Other wrist exercises  carrying 10lb weight 1x around gym - minimal discomfort      Manual Therapy   Soft tissue mobilization  right wrist extensors, flexors, supinator, and distal triceps             PT Education - 02/18/18 1009    Education provided  Yes    Education Details   Access Code: [email or web address]     Person(s)  Educated  Patient    Methods  Explanation;Demonstration;Handout;Verbal cues;Tactile cues    Comprehension  Verbalized understanding;Returned demonstration          PT Long Term Goals - 02/18/18 1125      PT LONG TERM GOAL #1   Title  ind with advanced HEP    Time  6    Period  Weeks    Status  On-going      PT LONG TERM GOAL #2   Title  pt will have no palpable trigger points in right forearm due to improved soft tissue length    Baseline  improved    Time  6    Period  Weeks    Status  On-going      PT LONG TERM GOAL #3   Title  FOTO score of 27% or less limitation    Baseline  39%    Time  6    Period  Weeks    Status  On-going      PT LONG TERM GOAL #4   Title  grip strength of right hand >  65 lb with no pain    Time  6    Period  Weeks    Status  On-going      PT LONG TERM GOAL #5   Title  pt will be able to return to tennis at least 1x/week without increased pain    Baseline  tried a little still increased pain    Time  6    Period  Weeks    Status  On-going            Plan - 02/18/18 1006    Clinical Impression Statement  Patient is doing well with exerises and was able to add to HEP.  She is able to carry 10lb with only very minimal pain.  Pt has trigger points along wrist extensors and flexors which lengthened with soft tissue release . Pt will benefit from skilled PT in order to progress strength and soft tissue length for return to activities.    PT Treatment/Interventions  ADLs/Self Care Home Management;Biofeedback;Cryotherapy;Electrical Stimulation;Moist Heat;Iontophoresis 65m/ml Dexamethasone;Ultrasound;Therapeutic activities;Therapeutic exercise;Neuromuscular re-education;Patient/family education;Manual techniques;Passive range of motion;Dry needling;Taping    PT Next Visit Plan  f/u on ionto, STM and dry needling to right wrist extensors, flexors, tricep as needed; stretches and eccentric strengthening, increased restistance with exercises as  tolerated    Consulted and Agree with Plan of Care  Patient       Patient will benefit from skilled therapeutic intervention in order to improve the following deficits and impairments:  Increased muscle spasms, Pain, Decreased strength  Visit Diagnosis: Pain in right elbow  Muscle weakness (generalized)  Other muscle spasm     Problem List There are no active problems to display for this patient.   JZannie Cove PT 02/18/2018, 11:26 AM  [facility] [address] [place] [id number]: [phone]  Fax:  [phone] Name: Rebecca WETHERELLMRN: [id number] of Birth: 1September 05, 1967 PHYSICAL THERAPY DISCHARGE SUMMARY  Visits from Start of Care: 5  Current functional level related to goals / functional outcomes: See above goals   Remaining deficits: See above   Education / Equipment: HEP Plan: Patient agrees to discharge.  Patient goals were not met. Patient is being discharged due to not returning since the last visit.  ?????    J[employer] PT 06/25/18 4:08 PM

## 2018-02-21 ENCOUNTER — Encounter: Payer: Self-pay | Admitting: Sports Medicine

## 2018-02-21 ENCOUNTER — Ambulatory Visit (INDEPENDENT_AMBULATORY_CARE_PROVIDER_SITE_OTHER): Payer: 59 | Admitting: Sports Medicine

## 2018-02-21 DIAGNOSIS — M7711 Lateral epicondylitis, right elbow: Secondary | ICD-10-CM

## 2018-02-21 MED ORDER — NITROGLYCERIN 0.2 MG/HR TD PT24: MEDICATED_PATCH | TRANSDERMAL | 1 refills | Status: DC

## 2018-02-21 NOTE — Assessment & Plan Note (Signed)
Patient is here to follow-up regarding her lateral epicondylitis.  No significant improvement since initiating treatment 4 weeks ago.  No worsening of symptoms or new injury/trauma's.  Has been undergoing physical therapy and dry needling. -Continued encouragement to ice regularly and take OTC anti-inflammatories as needed (recommended naproxen) -We will add nitroglycerin patches to her regimen. -Can discontinue formal physical therapy. -Continue HEP >> updated some exercises with some additional ones of arch using (active wrist extension with Thera-Band) -Continue avoidance of tennis and other aggravating activities using pain as a guide. -Patient is to follow-up in 6 weeks  Next: We discussed the possibility of PRP injections if symptoms persist/worsen.  We may repeat ultrasound if symptoms persist/worsen.

## 2018-02-21 NOTE — Patient Instructions (Signed)

## 2018-02-21 NOTE — Progress Notes (Signed)
   HPI  CC: Right lateral epicondylitis follow-up Patient is here to follow-up regarding her right-sided lateral epicondylitis.  She states she has not had significant improvement since last being seen approximately 1 month ago.  She endorses compliance with the physical therapy prescription but states that she has not noticed much difference, and the dry needling she underwent did not seem to help.  She states that she has not been as compliant with her home exercises as much as she should (only once a day every day of the week), and she has not been compliant with icing (irregular at best).  Patient has not been taking any oral anti-inflammatories either.  She states that she has avoided any participation in tennis and has tried to avoid strenuous activities that may further injure this site.  She denies any new symptoms.  No injury or trauma since last visit.  No weakness, numbness, or paresthesias.  Medications/Interventions Tried: HEP, PT  See HPI and/or previous note for associated ROS.  Objective: BP 126/84   Ht  (1.651 m)   Wt 150 lb (68 kg)   LMP 01/03/2010 (Exact Date)   BMI 24.96 kg/m  Gen: Right-Hand Dominant. NAD, well groomed, a/o x3, normal affect.  CV: Well-perfused. Warm.  Resp: Non-labored.  Neuro: Sensation intact throughout. No gross coordination deficits.  Gait: Nonpathologic posture, unremarkable stride without signs of limp or balance issues. Elbow, Right: Patient reports TTP at the lateral epicondyle w/o radiation distally or proximally. Inspection yields no evidence of bony deformity, effusion, erythema, ecchymosis, or rash. Active and passive ROM intact in flexion/extension/supination/pronation. Strength 5/5 throughout. No TTP at the medial epicondyle.  Exacerbation of pain with finger/wrist extension against resistance. No pain with finger/wrist flexion against resistance. No evidence of pain or laxity at the UCL. NVI distally.   Assessment and  plan:  Lateral epicondylitis of right elbow Patient is here to follow-up regarding her lateral epicondylitis.  No significant improvement since initiating treatment 4 weeks ago.  No worsening of symptoms or new injury/trauma's.  Has been undergoing physical therapy and dry needling. -Continued encouragement to ice regularly and take OTC anti-inflammatories as needed (recommended naproxen) -We will add nitroglycerin patches to her regimen. -Can discontinue formal physical therapy. -Continue HEP >> updated some exercises with some additional ones of arch using (active wrist extension with Thera-Band) -Continue avoidance of tennis and other aggravating activities using pain as a guide. -Patient is to follow-up in 6 weeks  Next: We discussed the possibility of PRP injections if symptoms persist/worsen.  We may repeat ultrasound if symptoms persist/worsen.   Meds ordered this encounter  Medications  . nitroGLYCERIN (NITRODUR - DOSED IN MG/24 HR) 0.2 mg/hr patch    Sig: Place 1/4 patch to the affected area daily.    Dispense:  30 patch    Refill:  1     Kathee Delton, MD,MS [facility] Sports Medicine Fellow 02/21/2018 12:08 PM   Patient seen and evaluated with the sports medicine fellow. I agree with the above plan of care. Patient will stop formal physical therapy and will start a home exercise program. Exercises were demonstrated for her today. We will also start some topical nitroglycerin. She will use a quarter patch daily. She is to continue avoiding tennis which she is fine with. Follow-up with me again in 6 weeks. If symptoms improve or worsen at follow-up, then repeat ultrasound. I also spoke to her briefly about the possibility of PRP if symptoms worsen.

## 2018-04-02 ENCOUNTER — Ambulatory Visit: Payer: 59 | Admitting: Sports Medicine

## 2018-04-02 VITALS — BP 110/66 | Ht 65.0 in | Wt 152.0 lb

## 2018-04-02 DIAGNOSIS — M7711 Lateral epicondylitis, right elbow: Secondary | ICD-10-CM | POA: Diagnosis not present

## 2018-04-02 NOTE — Progress Notes (Signed)
   Subjective:    Patient ID: Rebecca Browning, female    DOB: [birth year], 52 y.o.   MRN: [id number]  HPI  Patient is a 52 yo female who present today to follow up on her lateral epicondylitis of her right elbow. Patient reports that pain has improved since last time she was seen in clinic. She has not played tennis since early April. She was instructed to do home exercises daily, but patient reports she has only been doing it once or twice a week. She has also use nitroglycerin patch for the first two weeks and has not used them since. She endorses improvement in her pain around her right elbow. She still report intermittent twinge in her elbow with certain movement but overall she is much improved. She has been bouncing a tennis ball on her racket with no pain but has not taken a swing yet as she is try to be careful not to exacerbate her pain.   Review of Systems  Constitutional: Negative.   HENT: Negative.   Musculoskeletal: Positive for arthralgias.       Elbow tenderness   Skin: Negative.   Neurological: Negative.       Objective:   Physical Exam   Right elbow exam: no deformity, swelling, effusion or erythema noted. No tenderness to palpation around lateral epicondyle of right arm. Mild pain with resisted extension of 3rd digit while extended wrist. Mild tenderness on flexion, no pain with extension, supination or pronation. without any pain. Strength 5/5 and sensation intact. Normal reflexes. Palpable pulses.     Assessment & Plan:   #Right lateral epicondylitis, chronic, improving Patient present today following up on right lateral epicondylitis. Symptoms are improving with rest, and intermittent exercises and initial use of nitroglycerin. U/S today showed evidence of spurring however inflammation had significantly improved with no evidence of fluid. Recommend increase consistency with prescribed HEP exercises. Patient can gradually increase tennis related activity/movements based on  phased recovery program handout provided to patient.   Patient seen and evaluated with the resident. I agree with the above plan of care. Physical exam today shows minimal symptoms with ECRB testing. Bedside ultrasound shows the hypoechoic change in the deep portion of the common extensor tendon seen on the previous ultrasound to have now resolved. I have encouraged the patient to be more compliant with her eccentric exercises and I've also given her permission to gradually resume tennis. She has a counter-force brace to wear when playing. She has a copy of the return to tennis protocol. As long as she is able to return to her previous level of activity with improving symptoms, she may follow-up as needed.

## 2018-08-15 ENCOUNTER — Other Ambulatory Visit: Payer: Self-pay | Admitting: Certified Nurse Midwife

## 2018-08-15 DIAGNOSIS — Z1231 Encounter for screening mammogram for malignant neoplasm of breast: Secondary | ICD-10-CM

## 2018-10-08 ENCOUNTER — Ambulatory Visit: Payer: 59 | Admitting: Certified Nurse Midwife

## 2018-10-14 ENCOUNTER — Ambulatory Visit
Admission: RE | Admit: 2018-10-14 | Discharge: 2018-10-14 | Disposition: A | Discharge status: Home / Self Care | Payer: 59 | Source: Ambulatory Visit | Attending: Certified Nurse Midwife | Admitting: Certified Nurse Midwife

## 2018-10-14 DIAGNOSIS — Z1231 Encounter for screening mammogram for malignant neoplasm of breast: Secondary | ICD-10-CM

## 2018-10-17 ENCOUNTER — Ambulatory Visit: Payer: 59 | Admitting: Certified Nurse Midwife

## 2018-10-17 ENCOUNTER — Encounter: Payer: Self-pay | Admitting: Certified Nurse Midwife

## 2018-10-17 ENCOUNTER — Other Ambulatory Visit: Payer: Self-pay

## 2018-10-17 ENCOUNTER — Other Ambulatory Visit (HOSPITAL_COMMUNITY)
Admission: RE | Admit: 2018-10-17 | Discharge: 2018-10-17 | Disposition: A | Discharge status: Home / Self Care | Payer: 59 | Source: Ambulatory Visit | Attending: Certified Nurse Midwife | Admitting: Certified Nurse Midwife

## 2018-10-17 VITALS — BP 110/68 | HR 68 | Resp 16 | Ht 64.75 in | Wt 154.0 lb

## 2018-10-17 DIAGNOSIS — Z803 Family history of malignant neoplasm of breast: Secondary | ICD-10-CM

## 2018-10-17 DIAGNOSIS — Z124 Encounter for screening for malignant neoplasm of cervix: Secondary | ICD-10-CM | POA: Insufficient documentation

## 2018-10-17 DIAGNOSIS — Z01419 Encounter for gynecological examination (general) (routine) without abnormal findings: Secondary | ICD-10-CM

## 2018-10-17 DIAGNOSIS — R87619 Unspecified abnormal cytological findings in specimens from cervix uteri: Secondary | ICD-10-CM

## 2018-10-17 HISTORY — DX: Unspecified abnormal cytological findings in specimens from cervix uteri: R87.619

## 2018-10-17 NOTE — Patient Instructions (Signed)

## 2018-10-17 NOTE — Progress Notes (Addendum)
53 y.o. [id number] Married  Caucasian Fe here for annual exam. Menopausal no HRT. Denies vaginal bleeding or vaginal dryness. Sees Dr. Eloise Harman yearly, labs, cholesterol management and aex. No health issues today. Interested in genetic screening for breast cancer, due to family history with mother and sister. Staying busy with family! No other health concerns today.  Patient's last menstrual period was 01/03/2010 (exact date).          Sexually active: Yes.    The current method of family planning is post menopausal status.    Exercising: Yes.    cardio, weights & yoga Smoker:  no  Review of Systems  Constitutional: Negative.   HENT: Negative.   Eyes: Negative.   Respiratory: Negative.   Cardiovascular: Negative.   Gastrointestinal: Negative.   Genitourinary: Negative.   Musculoskeletal: Negative.   Skin: Negative.   Neurological: Negative.   Endo/Heme/Allergies: Negative.   Psychiatric/Behavioral: Negative.     Health Maintenance: Pap:  09-08-15 neg HPV HR neg, 09-14-17 neg HPV HR neg History of Abnormal Pap: no MMG:  10-14-18 category b density birads 1:neg Self Breast exams: yes Colonoscopy:  2015 f/u 22yrs BMD:   none TDaP:  2018 Shingles: not done Pneumonia: not done Hep C and HIV: both neg 2016 Labs: with PCP   reports that she has never smoked. She has never used smokeless tobacco. She reports current alcohol use. She reports that she does not use drugs.  No past medical history on file.  Past Surgical History:  Procedure Laterality Date  . DILATION AND CURETTAGE OF UTERUS     SAB  . LIPOSUCTION Bilateral 08/19/14   thighs  . POPLITEAL SYNOVIAL CYST EXCISION  1970's    Current Outpatient Medications  Medication Sig Dispense Refill  . Calcium Carbonate-Vitamin D3 (CALCIUM 600/VITAMIN D) 600-400 MG-UNIT TABS Take 1 tablet by mouth daily.    . Multiple Vitamin (MULTIVITAMIN) tablet Take 1 tablet by mouth daily. [employer] Multipack    . nitroGLYCERIN (NITRODUR - DOSED IN  MG/24 HR) 0.2 mg/hr patch Place 1/4 patch to the affected area daily. 30 patch 1  . Omega-3 Fatty Acids (FISH OIL PO) Take by mouth.     No current facility-administered medications for this visit.     Family History  Problem Relation Age of Onset  . Breast cancer Sister 70       mastectomy no chemo/ radiation  . Hypertension Mother   . Breast cancer Mother 39       lumpectomy, radiation  . Heart attack Father   . Heart disease Father        CABG  . Hypertension Father   . Hyperlipidemia Father   . Cancer Father 61       prostate  . Hyperlipidemia Brother   . Heart disease Maternal Uncle   . Hyperlipidemia Maternal Grandfather   . Heart disease Maternal Grandfather   . Hyperlipidemia Paternal Grandfather   . Heart disease Paternal Grandfather     ROS:  Pertinent items are noted in HPI.  Otherwise, a comprehensive ROS was negative.  Exam:   LMP 01/03/2010 (Exact Date)    Ht Readings from Last 3 Encounters:  04/02/18 5\' 5"  (1.651 m)  02/21/18 5\' 5"  (1.651 m)  01/22/18 5\' 5"  (1.651 m)    General appearance: alert, cooperative and appears stated age Head: Normocephalic, without obvious abnormality, atraumatic Neck: no adenopathy, supple, symmetrical, trachea midline and thyroid normal to inspection and palpation Lungs: clear to auscultation bilaterally Breasts: normal appearance,  no masses or tenderness, No nipple retraction or dimpling, No nipple discharge or bleeding, No axillary or supraclavicular adenopathy Heart: regular rate and rhythm Abdomen: soft, non-tender; no masses,  no organomegaly Extremities: extremities normal, atraumatic, no cyanosis or edema Skin: Skin color, texture, turgor normal. No rashes or lesions Lymph nodes: Cervical, supraclavicular, and axillary nodes normal. No abnormal inguinal nodes palpated Neurologic: Grossly normal   Pelvic: External genitalia:  no lesions, normal female              Urethra:  normal appearing urethra with no masses,  tenderness or lesions              Bartholin's and Skene's: normal                 Vagina: normal appearing vagina with normal color and discharge, no lesions              Cervix: multiparous appearance, no bleeding following Pap, no cervical motion tenderness and no lesions              Pap taken: Yes.   Bimanual Exam:  Uterus:  normal size, contour, position, consistency, mobility, non-tender              Adnexa: normal adnexa and no mass, fullness, tenderness               Rectovaginal: Confirms               Anus:  normal sphincter tone, no lesions  Chaperone present: yes  A:  Well Woman with normal exam  Menopausal no HRT  Family history of breast cancer mother, sister,  Spouse's mother has history of breast cancer, so concerned for daughters.    P:   Reviewed health and wellness pertinent to exam  Aware of need to advise if vaginal bleeding.  Discussed genetic consult possible genetic testing available and would recommend appropriate testing indicated by extensive family tree review. Questions addressed. Patient will consider and advise.  Continue follow up with PCP as indicated  Pap smear: yes   counseled on breast self exam, mammography screening, feminine hygiene, menopause, adequate intake of calcium and vitamin D, diet and exercise  return annually or prn  An After Visit Summary was printed and given to the patient.

## 2018-10-23 LAB — CYTOLOGY - PAP
Diagnosis: UNDETERMINED — AB
HPV: DETECTED — AB

## 2018-10-24 ENCOUNTER — Other Ambulatory Visit: Payer: Self-pay | Admitting: Certified Nurse Midwife

## 2018-10-24 ENCOUNTER — Telehealth: Payer: Self-pay | Admitting: *Deleted

## 2018-10-24 DIAGNOSIS — R87610 Atypical squamous cells of undetermined significance on cytologic smear of cervix (ASC-US): Secondary | ICD-10-CM

## 2018-10-24 DIAGNOSIS — R87810 Cervical high risk human papillomavirus (HPV) DNA test positive: Principal | ICD-10-CM

## 2018-10-24 NOTE — Telephone Encounter (Signed)
Notes recorded by Leda MinHamm, Nakyia Dau N, RN on 10/24/2018 at 10:08 AM EST Left message to call Noreene LarssonJill, RN at [facility] [phone]

## 2018-10-24 NOTE — Telephone Encounter (Signed)
Spoke with patient, advised as seen below per [name], CNM. Patient is postmenopausal.  Brief explanation of colpo provided, questions answered. Colpo scheudled for 1/31 at 10am with [name], CNM. Advised to take Motrin 800 mg with food and water one hour before procedure. Order placed previously for precert. Patient verbalizes understanding and is agreeable.   Routing to [employer].   Encounter closed.

## 2018-10-24 NOTE — Telephone Encounter (Signed)
Patient is returning call to Jill. °

## 2018-10-24 NOTE — Telephone Encounter (Signed)
-----   Message from Rebecca Browning, CNM sent at 10/24/2018  7:23 AM EST ----- Notify patient her pap smear was abnormal with ASCUS + HPV She will need colposcopy, please schedule  Order placed

## 2018-11-01 ENCOUNTER — Other Ambulatory Visit: Payer: Self-pay

## 2018-11-01 ENCOUNTER — Ambulatory Visit (INDEPENDENT_AMBULATORY_CARE_PROVIDER_SITE_OTHER): Payer: 59 | Admitting: Certified Nurse Midwife

## 2018-11-01 ENCOUNTER — Encounter: Payer: Self-pay | Admitting: Certified Nurse Midwife

## 2018-11-01 VITALS — BP 110/70 | HR 64 | Resp 16 | Wt 154.0 lb

## 2018-11-01 DIAGNOSIS — R87810 Cervical high risk human papillomavirus (HPV) DNA test positive: Secondary | ICD-10-CM

## 2018-11-01 DIAGNOSIS — R87610 Atypical squamous cells of undetermined significance on cytologic smear of cervix (ASC-US): Secondary | ICD-10-CM

## 2018-11-01 HISTORY — PX: COLPOSCOPY W/ BIOPSY / CURETTAGE: SUR283

## 2018-11-01 NOTE — Progress Notes (Signed)
10-17-2018 ASCUS HPV HR+ Patient took 800mg  ibuprofen at 9:15am

## 2018-11-01 NOTE — Patient Instructions (Signed)

## 2018-11-01 NOTE — Progress Notes (Addendum)
Patient ID: Rebecca Browning, female   DOB: 1966-03-06, 53 y.o.   MRN: [id number]  Chief Complaint  Patient presents with  . Colposcopy    //jj    HPI Rebecca Browning is a 53 y.o. white married female g [id number] . Here for colposcopy for abnormal pap smear. Denies vaginal bleeding or pelvic pain.   Indications: Pap smear on January  16,2020 showed: ASCUS with POSITIVE high risk HPV. Previous colposcopy: none. Prior cervical treatment: not applicable.  Past Medical History:  Diagnosis Date  . Abnormal Pap smear of cervix 10/17/2018   ASCUS HPV HR+  . Tendon tear     Past Surgical History:  Procedure Laterality Date  . DILATION AND CURETTAGE OF UTERUS     SAB  . LIPOSUCTION Bilateral 08/19/14   thighs  . POPLITEAL SYNOVIAL CYST EXCISION  1970's    Family History  Problem Relation Age of Onset  . Breast cancer Sister [id number]       mastectomy no chemo/ radiation  . Hypertension Mother   . Breast cancer Mother [id number]       lumpectomy, radiation  . Heart attack Father   . Heart disease Father        CABG  . Hypertension Father   . Hyperlipidemia Father   . Cancer Father [phone]       prostate  . Hyperlipidemia Brother   . Heart disease Maternal Uncle   . Hyperlipidemia Maternal Grandfather   . Heart disease Maternal Grandfather   . Hyperlipidemia Paternal Grandfather   . Heart disease Paternal Grandfather     Social History Social History   Tobacco Use  . Smoking status: Never Smoker  . Smokeless tobacco: Never Used  Substance Use Topics  . Alcohol use: Yes    Alcohol/week: 2.0 standard drinks    Types: 2 Standard drinks or equivalent per week  . Drug use: No    No Known Allergies  Current Outpatient Medications  Medication Sig Dispense Refill  . Calcium Carbonate-Vitamin D3 (CALCIUM 600/VITAMIN D) 600-400 MG-UNIT TABS Take 1 tablet by mouth daily.    . Multiple Vitamin (MULTIVITAMIN) tablet Take 1 tablet by mouth daily. [employer] Multipack    . Omega-3 Fatty Acids (FISH OIL  PO) Take by mouth.     No current facility-administered medications for this visit.     Review of Systems Review of Systems  Blood pressure 110/70, pulse 64, resp. rate 16, weight 154 lb (69.9 kg), last menstrual period 01/03/2010.  Physical Exam Physical Exam Constitutional:      Appearance: Normal appearance.  Cardiovascular:     Rate and Rhythm: Normal rate.  Pulmonary:     Effort: Pulmonary effort is normal.  Genitourinary:    General: Normal vulva.     Exam position: Lithotomy position.     Labia:        Right: No rash, tenderness or lesion.        Left: No rash, tenderness or lesion.      Lymphadenopathy:     Lower Body: No right inguinal adenopathy. No left inguinal adenopathy.  Skin:    General: Skin is warm and dry.  Neurological:     Mental Status: She is alert and oriented to person, place, and time.  Psychiatric:        Attention and Perception: Attention normal.        Mood and Affect: Mood normal.        Speech: Speech normal.  Behavior: Behavior normal.        Thought Content: Thought content normal.        Cognition and Memory: Cognition normal.        Judgment: Judgment normal.     Data Reviewed Reviewed pap smear results with patient. Questions addressed.  Assessment    ASCUS pap with + HPV Procedure Details  The risks and benefits of the procedure and Written informed consent obtained.  Speculum placed in vagina and excellent visualization of cervix achieved, cervix swabbed x 3 with saline and  acetic acid solution. Acetowhite response noted at 3 o'clock, Lugol's applied with light staining in same area. Biopsy taken. ECC taken. Monsel's applied. No active bleeding on removal of speculum. Patient tolerated procedure well. Printed and verbal instructions given. Patient escorted to check out with no issues.  Specimens: 2  Complications: none.     Plan    Specimens labelled and sent to Pathology. Patient will be called results once  reviewed. Patient notified of results that cervical biopsy showed atrophic squamous mucosa with mild inflammation and reactive atypia, no dysplasia or malignancy. Ecc showed atrophic squamous mucosa with reactive atypia and endocervical cells, no malignancy or dysplasia. This correlates well with ASCUS + HPV pap. Repeat pap smear in one year. 08 recall placed      Rebecca Browning 11/01/2018, 10:21 AM

## 2018-11-07 ENCOUNTER — Telehealth: Payer: Self-pay | Admitting: Emergency Medicine

## 2018-11-07 NOTE — Telephone Encounter (Signed)
Spoke with patient and message from [name] CNM given.  Verbalizes understanding of results and importance of follow up in one year.  08 recall in place.  Encounter closed.

## 2018-11-07 NOTE — Telephone Encounter (Signed)
-----   Message from Verner Chol, CNM sent at 11/07/2018  7:14 AM EST ----- Notify patient her colposcopy biopsy showed Atrophic squamous mucosa with mild inflammation and reactive atypia, no dysplasia or malignancy  ECC showed atrophic squamous mucosa with reactive atypia and endocervical cells, no malignancy or dysplasia  This correlates well with the ASCUS pap and +HPV. No further evaluation at this time. Needs repeat pap in one year. Pap recall 08

## 2018-11-07 NOTE — Telephone Encounter (Signed)
08 Recall entered. Message left to return call to Jamale Spangler at [phone].    

## 2019-10-21 ENCOUNTER — Other Ambulatory Visit: Payer: Self-pay

## 2019-10-22 ENCOUNTER — Encounter: Payer: Self-pay | Admitting: Certified Nurse Midwife

## 2019-10-22 ENCOUNTER — Ambulatory Visit: Payer: 59 | Admitting: Certified Nurse Midwife

## 2019-10-22 ENCOUNTER — Other Ambulatory Visit (HOSPITAL_COMMUNITY)
Admission: RE | Admit: 2019-10-22 | Discharge: 2019-10-22 | Disposition: A | Discharge status: Home / Self Care | Payer: 59 | Source: Ambulatory Visit | Attending: Certified Nurse Midwife | Admitting: Certified Nurse Midwife

## 2019-10-22 ENCOUNTER — Other Ambulatory Visit: Payer: Self-pay

## 2019-10-22 VITALS — BP 120/82 | HR 68 | Temp 97.5°F | Resp 16 | Ht 64.75 in | Wt 162.0 lb

## 2019-10-22 DIAGNOSIS — Z8742 Personal history of other diseases of the female genital tract: Secondary | ICD-10-CM

## 2019-10-22 DIAGNOSIS — Z01419 Encounter for gynecological examination (general) (routine) without abnormal findings: Secondary | ICD-10-CM | POA: Diagnosis not present

## 2019-10-22 DIAGNOSIS — Z124 Encounter for screening for malignant neoplasm of cervix: Secondary | ICD-10-CM | POA: Insufficient documentation

## 2019-10-22 DIAGNOSIS — N951 Menopausal and female climacteric states: Secondary | ICD-10-CM | POA: Diagnosis not present

## 2019-10-22 NOTE — Progress Notes (Signed)
54 y.o. [id number] Married  Caucasian Fe here for annual exam. Menopausal no denies vaginal bleeding or dryness.. 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 Took Covid vaccine recently, no issues. Sees PCP for labs, Vitamin D check. Now low dose of Crestor for cholesterol with no problems. No other health issues today.    Patient's last menstrual period was 01/03/2010 (exact date).          Sexually active: Yes.    The current method of family planning is post menopausal status.    Exercising: Yes.    tennis 2-3 times a week Smoker:  no  Review of Systems  Constitutional: Negative.   HENT: Negative.   Eyes: Negative.   Respiratory: Negative.   Cardiovascular: Negative.   Gastrointestinal: Negative.   Genitourinary: Negative.   Musculoskeletal: Negative.   Skin: Negative.   Neurological: Negative.   Endo/Heme/Allergies: Negative.   Psychiatric/Behavioral: Negative.     Health Maintenance: Pap:  09-14-17 neg HPV HR neg, 10-17-2018 ASCUS HPV HR+ History of Abnormal Pap: yes MMG:  10-14-2018 category b density birads 1:neg Self Breast exams: yes Colonoscopy:  2015 f/u 35yrs BMD:   none TDaP:  2018 Shingles: not done Pneumonia: not done Hep C and HIV: both neg 2016 Labs: PCP   reports that she has never smoked. She has never used smokeless tobacco. She reports current alcohol use of about 2.0 standard drinks of alcohol per week. She reports that she does not use drugs.  Past Medical History:  Diagnosis Date  . Abnormal Pap smear of cervix 10/17/2018   ASCUS HPV HR+  . Tendon tear     Past Surgical History:  Procedure Laterality Date  . DILATION AND CURETTAGE OF UTERUS     SAB  . LIPOSUCTION Bilateral 08/19/14   thighs  . POPLITEAL SYNOVIAL CYST EXCISION  1970's    Current Outpatient Medications  Medication Sig Dispense Refill  . Calcium Carbonate-Vitamin D3 (CALCIUM 600/VITAMIN  D) 600-400 MG-UNIT TABS Take 1 tablet by mouth daily.    . Multiple Vitamin (MULTIVITAMIN) tablet Take 1 tablet by mouth daily. [employer] Multipack    . Omega-3 Fatty Acids (FISH OIL PO) Take by mouth.     No current facility-administered medications for this visit.    Family History  Problem Relation Age of Onset  . Breast cancer Sister 49       mastectomy no chemo/ radiation  . Hypertension Mother   . Breast cancer Mother 21       lumpectomy, radiation  . Heart attack Father   . Heart disease Father        CABG  . Hypertension Father   . Hyperlipidemia Father   . Cancer Father 61       prostate  . Hyperlipidemia Brother   . Heart disease Maternal Uncle   . Hyperlipidemia Maternal Grandfather   . Heart disease Maternal Grandfather   . Hyperlipidemia Paternal Grandfather   . Heart disease Paternal Grandfather     ROS:  Pertinent items are noted in HPI.  Otherwise, a comprehensive ROS was negative.  Exam:   BP 140/90   Pulse 68   Temp (!) 97.5 F (36.4 C) (Skin)   Resp 16   Ht 5' 4.75" (1.645 m)   Wt 162 lb (73.5 kg)   LMP 01/03/2010 (Exact Date)   BMI 27.17 kg/m  Height: 5' 4.75" (164.5 cm) Ht  Readings from Last 3 Encounters:  10/22/19 5' 4.75" (1.645 m)  10/17/18 5' 4.75" (1.645 m)  04/02/18 5\' 5"  (1.651 m)    General appearance: alert, cooperative and appears stated age Head: Normocephalic, without obvious abnormality, atraumatic Neck: no adenopathy, supple, symmetrical, trachea midline and thyroid normal to inspection and palpation Lungs: clear to auscultation bilaterally Breasts: normal appearance, no masses or tenderness, No nipple retraction or dimpling, No nipple discharge or bleeding, No axillary or supraclavicular adenopathy Heart: regular rate and rhythm Abdomen: soft, non-tender; no masses,  no organomegaly Extremities: extremities normal, atraumatic, no cyanosis or edema Skin: Skin color, texture, turgor normal. No rashes or lesions Lymph nodes:  Cervical, supraclavicular, and axillary nodes normal. No abnormal inguinal nodes palpated Neurologic: Grossly normal   Pelvic: External genitalia:  no lesions              Urethra:  normal appearing urethra with no masses, tenderness or lesions              Bartholin's and Skene's: normal                 Vagina: normal appearing vagina with normal color and discharge, no lesions              Cervix: no cervical motion tenderness, no lesions and normal appearance, scant bleeding with pap only              Pap taken: Yes.   Bimanual Exam:  Uterus:  normal size, contour, position, consistency, mobility, non-tender and mid position              Adnexa: normal adnexa and no mass, fullness, tenderness               Rectovaginal: Confirms               Anus:  normal sphincter tone, no lesions  Chaperone present: yes  A:  Well Woman with normal exam  Menopausal no HRT  Cholesterol management with PCP  History of abnormal pap smear ASCUS + HPV follow up pap today with reactive atypia on biopsy  Mammogram due  P:   Reviewed health and wellness pertinent to exam  Aware of need to advise if vaginal bleeding  Continue follow up with PCP as indicated  Discussed Gardasil availability for HPV and option.Questions addressed.  Stressed importance of SBE and mammogram yearly.  Pap smear: yes  counseled on breast self exam, mammography screening, feminine hygiene, adequate intake of calcium and vitamin D, diet and exercise  return annually or prn  An After Visit Summary was printed and given to the patient.

## 2019-10-22 NOTE — Addendum Note (Signed)
Addended by: Verner Chol on: 10/22/2019 03:39 PM   Modules accepted: Orders

## 2019-10-22 NOTE — Patient Instructions (Signed)
EXERCISE AND DIET:  We recommended that you start or continue a regular exercise program for good health. Regular exercise means any activity that makes your heart beat faster and makes you sweat.  We recommend exercising at least 30 minutes per day at least 3 days a week, preferably 4 or 5.  We also recommend a diet low in fat and sugar.  Inactivity, poor dietary choices and obesity can cause diabetes, heart attack, stroke, and kidney damage, among others.    ALCOHOL AND SMOKING:  Women should limit their alcohol intake to no more than 7 drinks/beers/glasses of wine (combined, not each!) per week. Moderation of alcohol intake to this level decreases your risk of breast cancer and liver damage. And of course, no recreational drugs are part of a healthy lifestyle.  And absolutely no smoking or even second hand smoke. Most people know smoking can cause heart and lung diseases, but did you know it also contributes to weakening of your bones? Aging of your skin?  Yellowing of your teeth and nails?  CALCIUM AND VITAMIN D:  Adequate intake of calcium and Vitamin D are recommended.  The recommendations for exact amounts of these supplements seem to change often, but generally speaking 600 mg of calcium (either carbonate or citrate) and 800 units of Vitamin D per day seems prudent. Certain women may benefit from higher intake of Vitamin D.  If you are among these women, your doctor will have told you during your visit.    PAP SMEARS:  Pap smears, to check for cervical cancer or precancers,  have traditionally been done yearly, although recent scientific advances have shown that most women can have pap smears less often.  However, every woman still should have a physical exam from her gynecologist every year. It will include a breast check, inspection of the vulva and vagina to check for abnormal growths or skin changes, a visual exam of the cervix, and then an exam to evaluate the size and shape of the uterus and  ovaries.  And after 56 years of age, a rectal exam is indicated to check for rectal cancers. We will also provide age appropriate advice regarding health maintenance, like when you should have certain vaccines, screening for sexually transmitted diseases, bone density testing, colonoscopy, mammograms, etc.   MAMMOGRAMS:  All women over 52 years old should have a yearly mammogram. Many facilities now offer a "3D" mammogram, which may cost around $50 extra out of pocket. If possible,  we recommend you accept the option to have the 3D mammogram performed.  It both reduces the number of women who will be called back for extra views which then turn out to be normal, and it is better than the routine mammogram at detecting truly abnormal areas.    COLONOSCOPY:  Colonoscopy to screen for colon cancer is recommended for all women at age 18.  We know, you hate the idea of the prep.  We agree, BUT, having colon cancer and not knowing it is worse!!  Colon cancer so often starts as a polyp that can be seen and removed at colonscopy, which can quite literally save your life!  And if your first colonoscopy is normal and you have no family history of colon cancer, most women don't have to have it again for 10 years.  Once every ten years, you can do something that may end up saving your life, right?  We will be happy to help you get it scheduled when you are ready.  Be sure to check your insurance coverage so you understand how much it will cost.  It may be covered as a preventative service at no cost, but you should check your particular policy.     Human Papillomavirus Quadrivalent Vaccine suspension for injection What is this medicine? HUMAN PAPILLOMAVIRUS VACCINE (HYOO muhn pap uh LOH muh vahy ruhs vak SEEN) is a vaccine. It is used to prevent infections of four types of the human papillomavirus. In women, the vaccine may lower your risk of getting cervical, vaginal, vulvar, or anal cancer and genital warts. In men,  the vaccine may lower your risk of getting genital warts and anal cancer. You cannot get these diseases from the vaccine. This vaccine does not treat these diseases. This medicine may be used for other purposes; ask your health care provider or pharmacist if you have questions. COMMON BRAND NAME(S): Gardasil What should I tell my health care provider before I take this medicine? They need to know if you have any of these conditions:  fever or infection  hemophilia  HIV infection or AIDS  immune system problems  low platelet count  an unusual reaction to Human Papillomavirus Vaccine, yeast, other medicines, foods, dyes, or preservatives  pregnant or trying to get pregnant  breast-feeding How should I use this medicine? This vaccine is for injection in a muscle on your upper arm or thigh. It is given by a health care professional. You will be observed for 15 minutes after each dose. Sometimes, fainting happens after the vaccine is given. You may be asked to sit or lie down during the 15 minutes. Three doses are given. The second dose is given 2 months after the first dose. The last dose is given 4 months after the second dose. A copy of a Vaccine Information Statement will be given before each vaccination. Read this sheet carefully each time. The sheet may change frequently. Talk to your pediatrician regarding the use of this medicine in children. While this drug may be prescribed for children as young as 9 years of age for selected conditions, precautions do apply. Overdosage: If you think you have taken too much of this medicine contact a poison control center or emergency room at once. NOTE: This medicine is only for you. Do not share this medicine with others. What if I miss a dose? All 3 doses of the vaccine should be given within 6 months. Remember to keep appointments for follow-up doses. Your health care provider will tell you when to return for the next vaccine. Ask your health  care professional for advice if you are unable to keep an appointment or miss a scheduled dose. What may interact with this medicine?  other vaccines This list may not describe all possible interactions. Give your health care provider a list of all the medicines, herbs, non-prescription drugs, or dietary supplements you use. Also tell them if you smoke, drink alcohol, or use illegal drugs. Some items may interact with your medicine. What should I watch for while using this medicine? This vaccine may not fully protect everyone. Continue to have regular pelvic exams and cervical or anal cancer screenings as directed by your doctor. The Human Papillomavirus is a sexually transmitted disease. It can be passed by any kind of sexual activity that involves genital contact. The vaccine works best when given before you have any contact with the virus. Many people who have the virus do not have any signs or symptoms. Tell your doctor or health care professional   if you have any reaction or unusual symptom after getting the vaccine. What side effects may I notice from receiving this medicine? Side effects that you should report to your doctor or health care professional as soon as possible:  allergic reactions like skin rash, itching or hives, swelling of the face, lips, or tongue  breathing problems  feeling faint or lightheaded, falls Side effects that usually do not require medical attention (report to your doctor or health care professional if they continue or are bothersome):  cough  dizziness  fever  headache  nausea  redness, warmth, swelling, pain, or itching at site where injected This list may not describe all possible side effects. Call your doctor for medical advice about side effects. You may report side effects to FDA at 1-800-FDA-1088. Where should I keep my medicine? This drug is given in a hospital or clinic and will not be stored at home. NOTE: This sheet is a summary. It may  not cover all possible information. If you have questions about this medicine, talk to your doctor, pharmacist, or health care provider.  2020 Elsevier/Gold Standard (2013-11-10 13:14:33)  

## 2019-10-27 LAB — CYTOLOGY - PAP
Comment: NEGATIVE
Diagnosis: NEGATIVE
Diagnosis: REACTIVE
High risk HPV: POSITIVE — AB

## 2019-10-28 ENCOUNTER — Telehealth: Payer: Self-pay | Admitting: *Deleted

## 2019-10-28 ENCOUNTER — Telehealth: Payer: Self-pay | Admitting: Certified Nurse Midwife

## 2019-10-28 DIAGNOSIS — B977 Papillomavirus as the cause of diseases classified elsewhere: Secondary | ICD-10-CM

## 2019-10-28 DIAGNOSIS — Z8742 Personal history of other diseases of the female genital tract: Secondary | ICD-10-CM

## 2019-10-28 NOTE — Telephone Encounter (Signed)
Patient returning call to Jill. °

## 2019-10-28 NOTE — Telephone Encounter (Signed)
Patient returned my call and I conveyed the benefits. Patient understands/agreeable with the benefits. Patient is aware of the cancellation policy. Appointment scheduled 11/04/19.

## 2019-10-28 NOTE — Telephone Encounter (Signed)
Spoke with patient. Advised of results as seen below per [name], CNM. Patient is menopausal. Patient is familiar with colpo procedure. Colpo scheduled for 11/04/19 at 2:30pm with [name], CNM. Advised patient to take Motrin 800 mg with food and water one hour before procedure. Order placed for colpo for precert. Patient verbalizes understanding and is agreeable.   Routing to provider for final review. Patient is agreeable to disposition. Will close encounter.  Cc: Harland Dingwall, [place]

## 2019-10-28 NOTE — Telephone Encounter (Signed)
-----   Message from Verner Chol, CNM sent at 10/27/2019  8:11 AM EST ----- Notify patient her pap smear is normal but HPV positive. She will need repeat colposcopy due to this being a follow up from previous Colpo and positive. Please schedule

## 2019-10-28 NOTE — Telephone Encounter (Signed)
Leda Min, RN  10/28/2019 9:13 AM EST    Left message to call Noreene Larsson, RN at [facility] [phone].

## 2019-10-28 NOTE — Telephone Encounter (Signed)
Call placed to convey benefits for colposcopy. °

## 2019-11-04 ENCOUNTER — Other Ambulatory Visit: Payer: Self-pay

## 2019-11-04 ENCOUNTER — Ambulatory Visit (INDEPENDENT_AMBULATORY_CARE_PROVIDER_SITE_OTHER): Payer: 59 | Admitting: Certified Nurse Midwife

## 2019-11-04 ENCOUNTER — Other Ambulatory Visit (HOSPITAL_COMMUNITY)
Admission: RE | Admit: 2019-11-04 | Discharge: 2019-11-04 | Disposition: A | Discharge status: Home / Self Care | Payer: 59 | Source: Ambulatory Visit | Attending: Certified Nurse Midwife | Admitting: Certified Nurse Midwife

## 2019-11-04 ENCOUNTER — Encounter: Payer: Self-pay | Admitting: Certified Nurse Midwife

## 2019-11-04 VITALS — BP 110/80 | HR 70 | Temp 97.3°F | Resp 16 | Wt 165.0 lb

## 2019-11-04 DIAGNOSIS — B977 Papillomavirus as the cause of diseases classified elsewhere: Secondary | ICD-10-CM | POA: Diagnosis present

## 2019-11-04 DIAGNOSIS — R8789 Other abnormal findings in specimens from female genital organs: Secondary | ICD-10-CM

## 2019-11-04 DIAGNOSIS — Z8742 Personal history of other diseases of the female genital tract: Secondary | ICD-10-CM | POA: Insufficient documentation

## 2019-11-04 DIAGNOSIS — R87618 Other abnormal cytological findings on specimens from cervix uteri: Secondary | ICD-10-CM

## 2019-11-04 NOTE — Progress Notes (Signed)
10-22-2019 neg pap HPV HR +, previous pap from 10-17-2018 ASCUS HPV HR+. Patient took 1 naproxen at 1:50pm.

## 2019-11-04 NOTE — Progress Notes (Addendum)
Patient ID: Rebecca Browning, female   DOB: [birth year], 54 y.o.   MRN: [id number]  Chief Complaint  Patient presents with  . Procedure    colpo//jj    HPI Rebecca Browning is a 54 y.o. white married female g4 [id number]  Here for colposcopy exam. Denies pelvic pain or vaginal bleeding. Menopausal.  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 Previous colposcopy: 11/01/2018 for ASCUS pap Positive HPV showed reactive atypia, no dysplasia or malignancy . Prior cervical treatment: no treatment.  Past Medical History:  Diagnosis Date  . Abnormal Pap smear of cervix 10/17/2018    2020 ASCUS HPV HR+, 2021 neg HPV HR +  . Elevated cholesterol   . Tendon tear     Past Surgical History:  Procedure Laterality Date  . COLPOSCOPY W/ BIOPSY / CURETTAGE  11/01/2018   ASCUS + HPV  . DILATION AND CURETTAGE OF UTERUS     SAB  . LIPOSUCTION Bilateral 08/19/14   thighs  . POPLITEAL SYNOVIAL CYST EXCISION  1970's    Family History  Problem Relation Age of Onset  . Breast cancer Sister 48       mastectomy no chemo/ radiation  . Hypertension Mother   . Breast cancer Mother 31       lumpectomy, radiation  . Heart attack Father   . Heart disease Father        CABG  . Hypertension Father   . Hyperlipidemia Father   . Cancer Father 27       prostate  . Hyperlipidemia Brother   . Heart disease Maternal Uncle   . Hyperlipidemia Maternal Grandfather   . Heart disease Maternal Grandfather   . Hyperlipidemia Paternal Grandfather   . Heart disease Paternal Grandfather     Social History Social History   Tobacco Use  . Smoking status: Never Smoker  . Smokeless tobacco: Never Used  Substance Use Topics  . Alcohol use: Yes    Alcohol/week: 2.0 standard drinks    Types: 2 Standard drinks or equivalent per week  . Drug use: No    No Known Allergies  Current Outpatient Medications  Medication Sig Dispense Refill  . Calcium Carbonate-Vitamin D3 (CALCIUM 600/VITAMIN D) 600-400  MG-UNIT TABS Take 1 tablet by mouth as needed.     . Multiple Vitamin (MULTIVITAMIN) tablet Take 1 tablet by mouth daily. [employer] Multipack    . Probiotic Product (PROBIOTIC PO) Take by mouth.    . rosuvastatin (CRESTOR) 5 MG tablet Take 5 mg by mouth daily.     No current facility-administered medications for this visit.    Review of Systems Review of Systems  Constitutional: Negative.   HENT: Negative.   Eyes: Negative.   Respiratory: Negative.   Cardiovascular: Negative.   Gastrointestinal: Negative.   Endocrine: Negative.   Genitourinary: Negative.   Musculoskeletal: Negative.   Skin: Negative.   Allergic/Immunologic: Negative.   Neurological: Negative.   Hematological: Negative.   Psychiatric/Behavioral: Negative.     Blood pressure 110/80, pulse 70, temperature (!) 97.3 F (36.3 C), temperature source Skin, resp. rate 16, weight 165 lb (74.8 kg), last menstrual period 01/03/2010.  Physical Exam Physical Exam Exam conducted with a chaperone present.  Constitutional:      Appearance: Normal appearance.  Cardiovascular:     Rate and Rhythm: Normal rate.  Pulmonary:     Effort: Pulmonary effort is normal.  Genitourinary:    General: Normal vulva.     Exam position: Lithotomy position.  Labia:        Right: No rash, tenderness or lesion.        Left: No rash, tenderness or lesion.      Vagina: Normal.     Cervix: No cervical motion tenderness, discharge or cervical bleeding.     Skin:    General: Skin is warm and dry.  Neurological:     Mental Status: She is alert and oriented to person, place, and time.  Psychiatric:        Mood and Affect: Mood normal.        Behavior: Behavior normal.        Thought Content: Thought content normal.        Judgment: Judgment normal.     Data Reviewed Reviewed pap results with patient. Questions addressed.   Assessment     Procedure Details  The risks and benefits of the procedure and Written informed consent  obtained.  Speculum placed in vagina and excellent visualization of cervix achieved, cervix swabbed x 3 with acetic acid solution. Acetowhite effect noted at 2 and 12 o'clock. Cervix viewed with 3.75 7.5 and 15# with green filter also. Lugol's solution applied with very faint staining noted in same area. Biopsy taken at 2 and 12 o'clock. HPV effect noted at 12 o'clock.  ECC obtained. Monsel's applied to biopsy sites. No active bleeding noted. Speculum removed. Patient tolerated procedure well.Instructions given verbal and printed. Patient escorted to check out in stable condition.  Specimens: 3  Complications: none.     Plan    Specimens labelled and sent to Pathology. Patient will be contacted with biopsy results are in and reviewed. 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 Patient notified of results and need to repeat pap smear with HPV in 1 year as follow up.  Placed in 12 recall      [name] 11/04/2019, 2:59 PM

## 2019-11-04 NOTE — Patient Instructions (Signed)

## 2019-11-07 ENCOUNTER — Telehealth: Payer: Self-pay | Admitting: Certified Nurse Midwife

## 2019-11-07 LAB — SURGICAL PATHOLOGY

## 2019-11-07 NOTE — Telephone Encounter (Signed)
Phone call to patient regarding pathology results from colpo. 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 Discussed HPV affect on cervical changes. She may want to consider Gardasil vaccine. Given information regarding. Patient doing since procedure, no bleeding or pain. Discussed follow up is repeat pap smear in one year with HPV screening. Questions addressed. Patient will call if other concerns.

## 2019-11-07 NOTE — Progress Notes (Signed)
Notified patient of Pathology results from colposcopy for abnormal pap finding of HPV. 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 Repeat pap smear for follow up in one year. 12 recall See telephone note

## 2019-11-27 ENCOUNTER — Other Ambulatory Visit: Payer: Self-pay | Admitting: Certified Nurse Midwife

## 2019-11-27 DIAGNOSIS — Z1231 Encounter for screening mammogram for malignant neoplasm of breast: Secondary | ICD-10-CM

## 2019-12-22 ENCOUNTER — Encounter: Payer: Self-pay | Admitting: Certified Nurse Midwife

## 2019-12-23 ENCOUNTER — Other Ambulatory Visit: Payer: Self-pay

## 2019-12-23 ENCOUNTER — Ambulatory Visit
Admission: RE | Admit: 2019-12-23 | Discharge: 2019-12-23 | Disposition: A | Discharge status: Home / Self Care | Payer: 59 | Source: Ambulatory Visit | Attending: Certified Nurse Midwife | Admitting: Certified Nurse Midwife

## 2019-12-23 DIAGNOSIS — Z1231 Encounter for screening mammogram for malignant neoplasm of breast: Secondary | ICD-10-CM

## 2019-12-23 NOTE — Progress Notes (Signed)
Mammogram reviewed negative. Birads 1 density B repeat yearly

## 2020-10-25 ENCOUNTER — Ambulatory Visit (INDEPENDENT_AMBULATORY_CARE_PROVIDER_SITE_OTHER): Payer: 59 | Admitting: Nurse Practitioner

## 2020-10-25 ENCOUNTER — Other Ambulatory Visit: Payer: Self-pay

## 2020-10-25 ENCOUNTER — Other Ambulatory Visit (HOSPITAL_COMMUNITY)
Admission: RE | Admit: 2020-10-25 | Discharge: 2020-10-25 | Disposition: A | Discharge status: Home / Self Care | Payer: 59 | Source: Ambulatory Visit | Attending: Nurse Practitioner | Admitting: Nurse Practitioner

## 2020-10-25 ENCOUNTER — Encounter: Payer: Self-pay | Admitting: Nurse Practitioner

## 2020-10-25 ENCOUNTER — Ambulatory Visit: Payer: 59 | Admitting: Certified Nurse Midwife

## 2020-10-25 VITALS — BP 116/72 | HR 68 | Resp 16 | Ht 65.25 in | Wt 158.0 lb

## 2020-10-25 DIAGNOSIS — N870 Mild cervical dysplasia: Secondary | ICD-10-CM

## 2020-10-25 DIAGNOSIS — Z01419 Encounter for gynecological examination (general) (routine) without abnormal findings: Secondary | ICD-10-CM

## 2020-10-25 NOTE — Progress Notes (Signed)
55 y.o. [id number] Married White or Caucasian female here for annual exam.     Report early menopause age 32 Had colposcopy last year, LSIL including ECC Works as Engineer, civil (consulting) at [employer]  Patient's last menstrual period was 01/03/2010 (exact date).          Sexually active: Yes.    The current method of family planning is post menopausal status.    Exercising: Yes.    tennis, yoga, cardio Smoker:  no  Health Maintenance: Pap:  10-17-2018 ASCUS HPV HR+, 10-22-2019 neg HPV HR+ History of abnormal Pap:  yes MMG:  12-23-2019 category b density birads 1:neg Colonoscopy:  2015 f/u 68yrs BMD:   none TDaP:  2018 Gardasil:   n/a Covid-19: pfizer Hep C testing: neg 2016 Screening Labs: with PCP   reports that she has never smoked. She has never used smokeless tobacco. She reports current alcohol use of about 2.0 standard drinks of alcohol per week. She reports that she does not use drugs.  Past Medical History:  Diagnosis Date  . Abnormal Pap smear of cervix 10/17/2018    2020 ASCUS HPV HR+, 2021 neg HPV HR +  . Elevated cholesterol   . Tendon tear     Past Surgical History:  Procedure Laterality Date  . COLPOSCOPY W/ BIOPSY / CURETTAGE  11/01/2018   ASCUS + HPV  . DILATION AND CURETTAGE OF UTERUS     SAB  . LIPOSUCTION Bilateral 08/19/14   thighs  . POPLITEAL SYNOVIAL CYST EXCISION  1970's    Current Outpatient Medications  Medication Sig Dispense Refill  . Calcium Carbonate-Vitamin D3 600-400 MG-UNIT TABS Take 1 tablet by mouth as needed.     . Folic Acid (FOLATE PO) Take by mouth.    . Multiple Vitamin (MULTIVITAMIN) tablet Take 1 tablet by mouth daily. [employer] Multipack    . Probiotic Product (PROBIOTIC PO) Take by mouth.    . rosuvastatin (CRESTOR) 5 MG tablet Take 5 mg by mouth daily.     No current facility-administered medications for this visit.    Family History  Problem Relation Age of Onset  . Breast cancer Sister 28       mastectomy no chemo/ radiation  .  Hypertension Mother   . Breast cancer Mother 75       lumpectomy, radiation  . Heart attack Father   . Heart disease Father        CABG  . Hypertension Father   . Hyperlipidemia Father   . Cancer Father 4       prostate  . Hyperlipidemia Brother   . Heart disease Maternal Uncle   . Hyperlipidemia Maternal Grandfather   . Heart disease Maternal Grandfather   . Hyperlipidemia Paternal Grandfather   . Heart disease Paternal Grandfather     Review of Systems  Constitutional: Negative.   HENT: Negative.   Eyes: Negative.   Respiratory: Negative.   Cardiovascular: Negative.   Gastrointestinal: Negative.   Endocrine: Negative.   Genitourinary: Negative.   Musculoskeletal: Negative.   Skin: Negative.   Allergic/Immunologic: Negative.   Neurological: Negative.   Hematological: Negative.   Psychiatric/Behavioral: Negative.     Exam:   BP 116/72   Pulse 68   Resp 16   Ht 5' 5.25" (1.657 m)   Wt 158 lb (71.7 kg)   LMP 01/03/2010 (Exact Date)   BMI 26.09 kg/m   Height: 5' 5.25" (165.7 cm)  General appearance: alert, cooperative and appears stated age, no acute distress  Head: Normocephalic, without obvious abnormality Neck: no adenopathy, thyroid normal to inspection and palpation Lungs: clear to auscultation bilaterally Breasts: No axillary or supraclavicular adenopathy, Normal to palpation without dominant masses Heart: regular rate and rhythm Abdomen: soft, non-tender; no masses,  no organomegaly Extremities: extremities normal, no edema Skin: No rashes or lesions Lymph nodes: Cervical, supraclavicular, and axillary nodes normal. No abnormal inguinal nodes palpated Neurologic: Grossly normal   Pelvic: External genitalia:  no lesions              Urethra:  normal appearing urethra with no masses, tenderness or lesions              Bartholins and Skenes: normal                 Vagina: normal appearing vagina, appropriate for age, normal appearing discharge, no  lesions              Cervix: neg cervical motion tenderness, stenotic             Bimanual Exam:   Uterus:  normal size, contour, position, consistency, mobility, non-tender              Adnexa: no mass, fullness, tenderness                 Joy, CMA Chaperone was present for exam.  A: Well woman exam with routine gynecological exam  Dysplasia of cervix, low grade (CIN 1) - Plan: Cytology - PAP( [facility])    P:   Pap :collected today (LSIL last year) If unable to obtain endocervical cells, will try estrogen cream for 4 weeks, then re-try  Mammogram: pt will call to schedule  Labs:n/a, done with PCP  Recommend DEXA age 66 R/t early menopause age 61 (no HRT)

## 2020-10-25 NOTE — Patient Instructions (Signed)

## 2020-10-27 LAB — CYTOLOGY - PAP
Comment: NEGATIVE
Diagnosis: NEGATIVE
High risk HPV: NEGATIVE

## 2021-02-03 ENCOUNTER — Other Ambulatory Visit: Payer: Self-pay | Admitting: Obstetrics and Gynecology

## 2021-02-03 DIAGNOSIS — Z1231 Encounter for screening mammogram for malignant neoplasm of breast: Secondary | ICD-10-CM

## 2021-02-04 ENCOUNTER — Ambulatory Visit
Admission: RE | Admit: 2021-02-04 | Discharge: 2021-02-04 | Disposition: A | Discharge status: Home / Self Care | Payer: 59 | Source: Ambulatory Visit | Attending: Obstetrics and Gynecology | Admitting: Obstetrics and Gynecology

## 2021-02-04 ENCOUNTER — Other Ambulatory Visit: Payer: Self-pay

## 2021-02-04 DIAGNOSIS — Z1231 Encounter for screening mammogram for malignant neoplasm of breast: Secondary | ICD-10-CM

## 2021-10-23 IMAGING — MG MM DIGITAL SCREENING BILAT W/ TOMO AND CAD
8 series · 9 of 24 positions shown · non-contrast
Comparison: Previous exam(s).

CLINICAL DATA: Screening.

EXAM:
DIGITAL SCREENING BILATERAL MAMMOGRAM WITH TOMOSYNTHESIS AND CAD
TECHNIQUE: Bilateral screening digital craniocaudal and mediolateral oblique
mammograms were obtained. Bilateral screening digital breast
tomosynthesis was performed. The images were evaluated with
computer-aided detection.

[R MLO synth-2D]
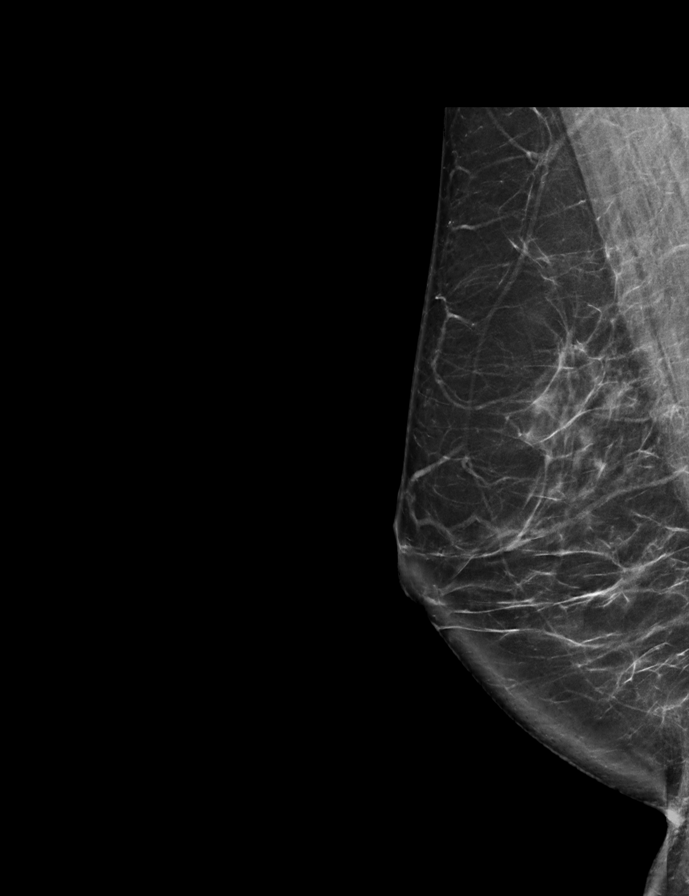

[R CC synth-2D]
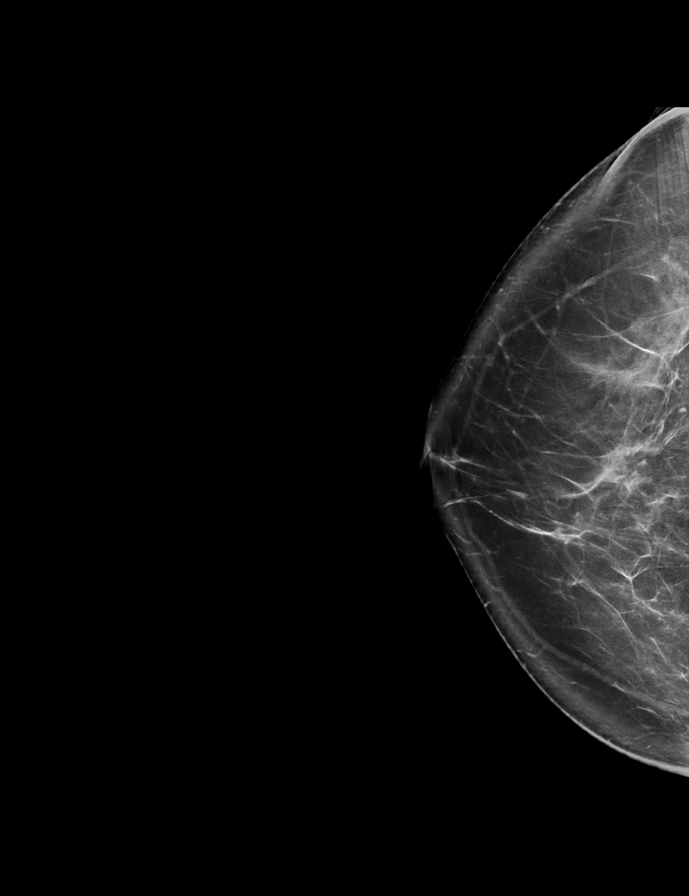

[L MLO synth-2D]
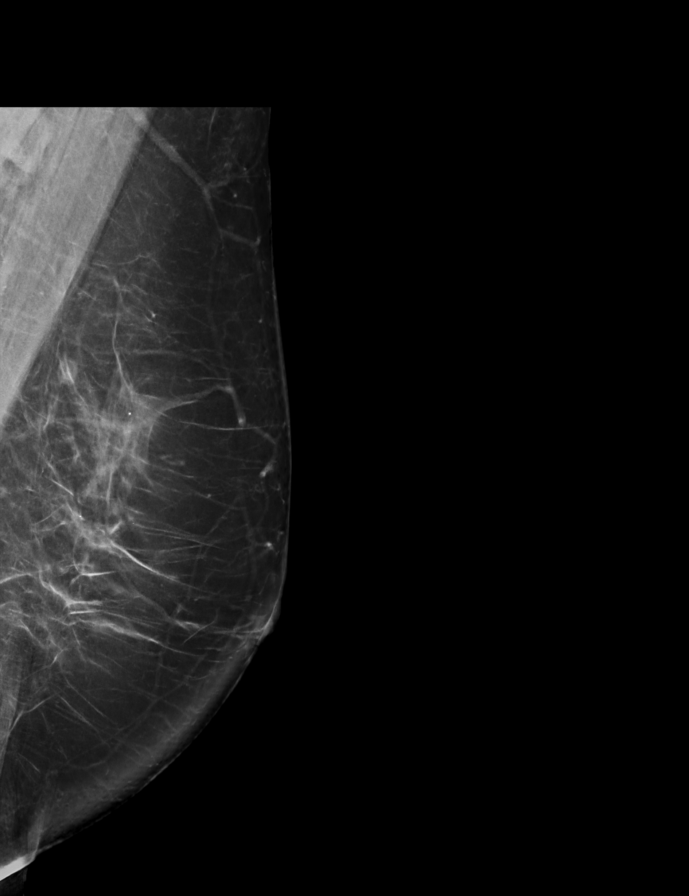

[L CC synth-2D]
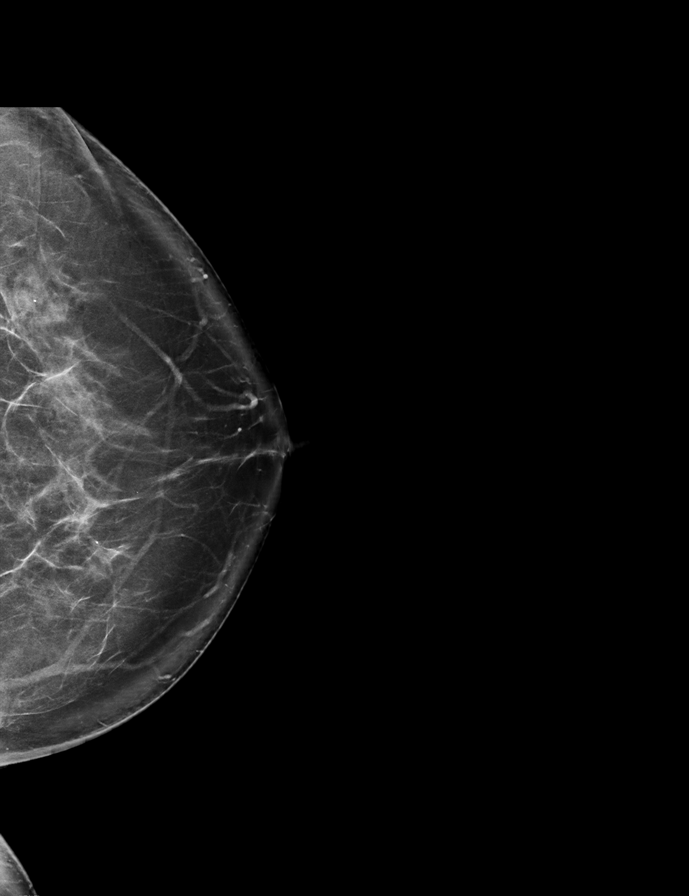

[R CC tomo · 2 of 83 frames shown]
[frame 27/83]
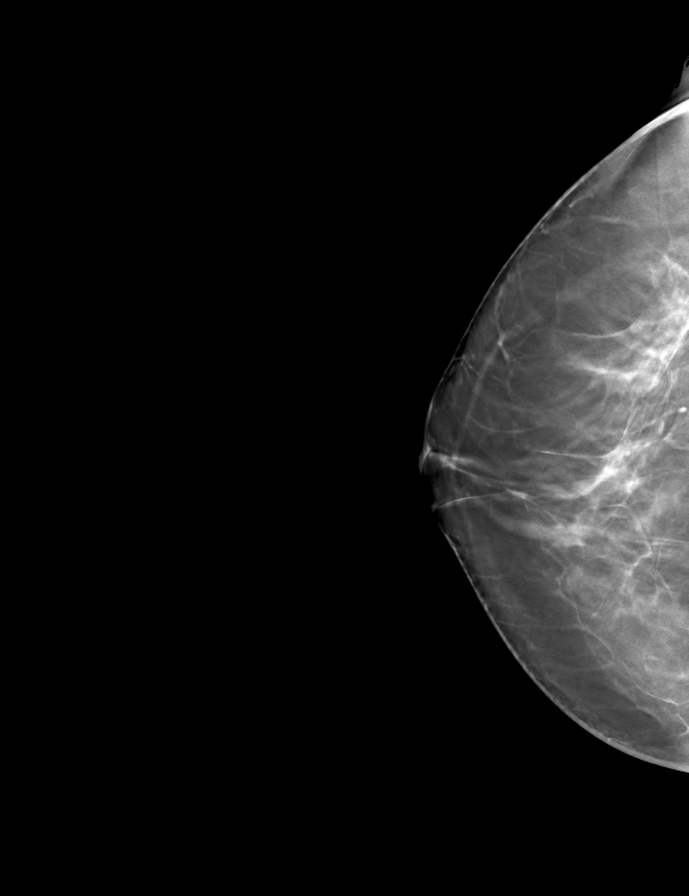
[frame 42/83]
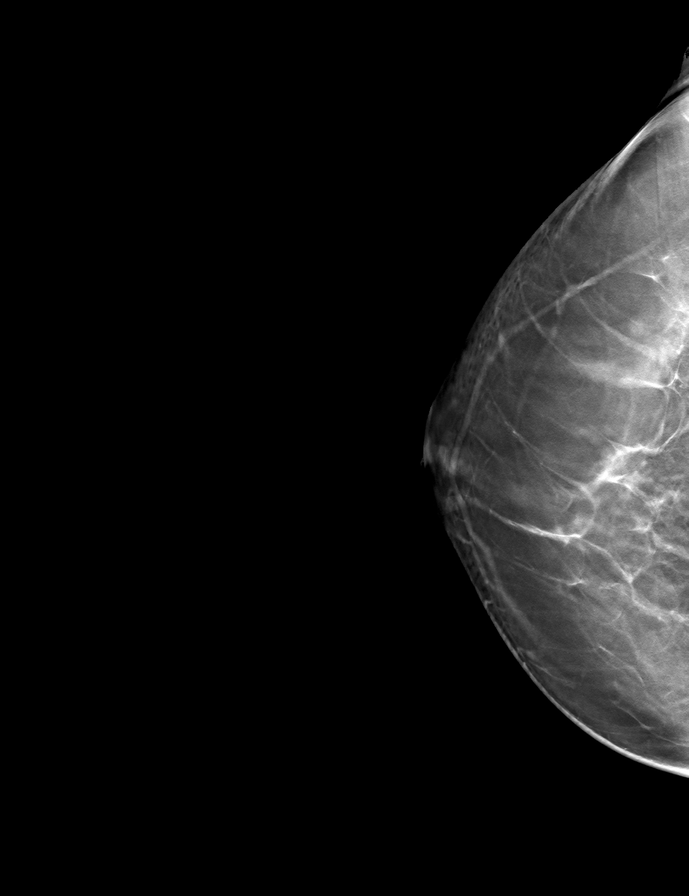

[L MLO tomo · tomo slice 47/92.0]
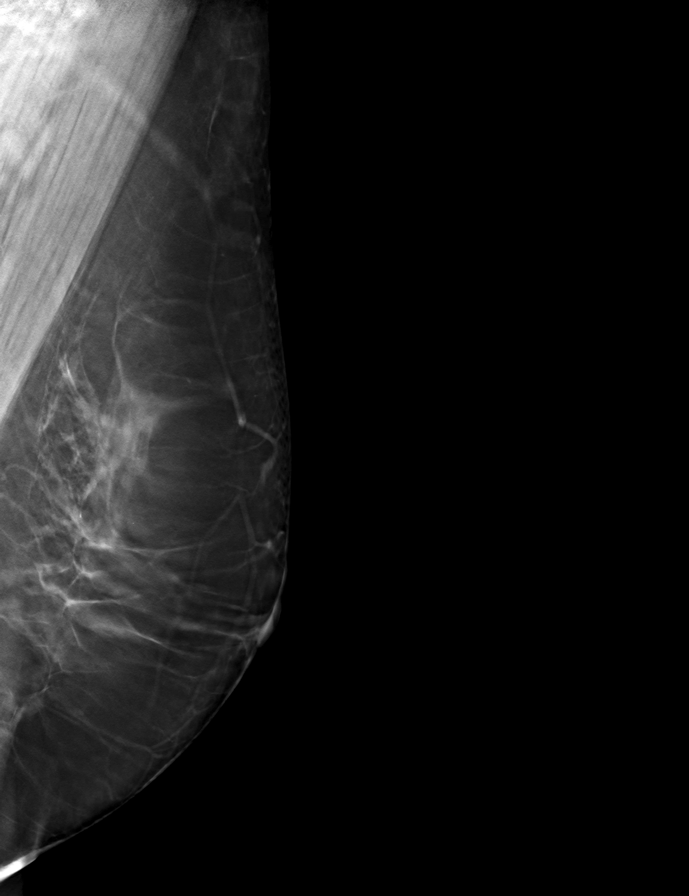

[L CC tomo · tomo slice 43/85.0]
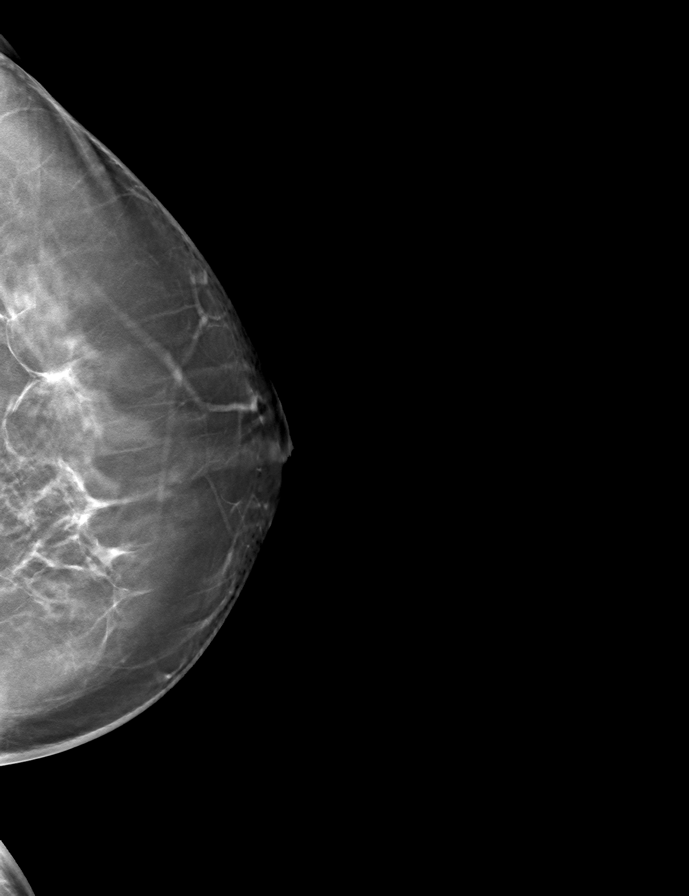

[R MLO tomo · tomo slice 39/78.0]
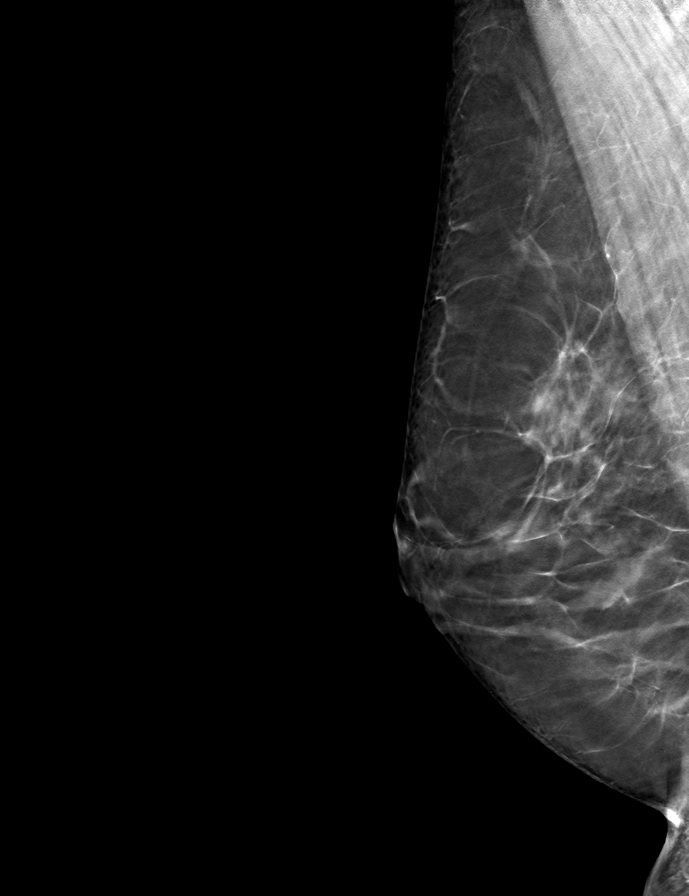

[9 of 24 positions shown; findings below may reference images not displayed]

ACR Breast Density Category b: There are scattered areas of
fibroglandular density.
FINDINGS: There are no findings suspicious for malignancy. The images were
evaluated with computer-aided detection.
IMPRESSION: No mammographic evidence of malignancy. A result letter of this
screening mammogram will be mailed directly to the patient.

RECOMMENDATION:
Screening mammogram in one year. (Code:[id number]-[id number])

BI-RADS CATEGORY  1: Negative.

## 2022-03-21 ENCOUNTER — Other Ambulatory Visit: Payer: Self-pay | Admitting: Obstetrics and Gynecology

## 2022-03-21 DIAGNOSIS — Z1231 Encounter for screening mammogram for malignant neoplasm of breast: Secondary | ICD-10-CM

## 2022-03-28 ENCOUNTER — Ambulatory Visit
Admission: RE | Admit: 2022-03-28 | Discharge: 2022-03-28 | Disposition: A | Discharge status: Home / Self Care | Payer: 59 | Source: Ambulatory Visit | Attending: Obstetrics and Gynecology | Admitting: Obstetrics and Gynecology

## 2022-03-28 DIAGNOSIS — Z1231 Encounter for screening mammogram for malignant neoplasm of breast: Secondary | ICD-10-CM

## 2023-03-29 ENCOUNTER — Other Ambulatory Visit: Payer: Self-pay

## 2023-03-29 ENCOUNTER — Ambulatory Visit: Payer: 59 | Admitting: Sports Medicine

## 2023-03-29 VITALS — BP 122/84 | Ht 65.0 in | Wt 155.0 lb

## 2023-03-29 DIAGNOSIS — M25521 Pain in right elbow: Secondary | ICD-10-CM

## 2023-03-29 DIAGNOSIS — M7701 Medial epicondylitis, right elbow: Secondary | ICD-10-CM

## 2023-03-29 NOTE — Progress Notes (Signed)
PCP: Garlan Fillers, MD  Subjective:   HPI: Patient is a 57 y.o. female with past medical history significant for right lateral epicondylitis here for right medial elbow pain.  Patient has aching pain at her medial elbow which began 2 months ago possibly around the time she had her tennis racket strings changed. Pain would come on after playing tennis and gradually become worse. She now has pain when playing. She has changed her tennis game to hit more slices and avoided forehand. She has tried a few ibuprofen and occasional tylenol which helped pain. She has tried a counter force brace she had from history of tennis elbow. She found needling was helpful for her tennis elbow.  She is playing tennis 2-4 times per week for at least 1 hour. 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         Objective:  Physical Exam:  Gen: NAD, comfortable in exam room  Elbow, right: Patient reports TTP at the medial epicondyles. Inspection yields no evidence of bony deformity, effusion, erythema, ecchymosis, or rash. Active and passive ROM intact in flexion/extension/supination/pronation. Strength 5/5 throughout.  Mild tenderness at medial epicondyle, no tenderness at lateral epicondyle. No pain with finger/wrist extension against resistance.  No evidence of pain or laxity at the UCL. Pain at medial epicondyle with flexion of elbow against resistance  Limited ultrasound right elbow: Patient has hypoechoic changes seen in 2 views at medial epicondyle.  Findings consistent with edema at the origin of the common flexor tendon and probable underlying interstitial tearing.   Assessment & Plan:  1.  Right medial elbow pain Exam difficult for pain at medial epicondyle with flexion against resistance. Ultrasound exam showed changes consistent with inflammation at medial epicondyle.  Patient has medial epicondylitis She is already used a counterforce brace without improvement.  Recommend wearing  elbow compression sleeve when doing activities such as tennis or weight training Recommend topical Voltaren gel for pain Referral placed to PT at [facility] Follow-up if symptoms worsen or fail to improve  Patient seen and evaluated with the ultrasound fellow.  I agree with the above plan of care.  Physical exam and ultrasound findings consistent with common flexor tendon tendinopathy.  Treatment as above with compression sleeve, home exercises, and referral to formal physical therapy at [facility] where she has had good success with dry needling for lateral epicondylitis in the past.  I also recommended that she work with a Engineer, manufacturing systems to see if there is something about her tennis form or racquet that needs to be changed.  I think she can continue with tennis as she begins rehab but if symptoms persist she may need to take some time away from the game.  She will follow-up with me for ongoing or recalcitrant issues.  This note was dictated using Dragon naturally speaking software and may contain errors in syntax, spelling, or content which have not been identified prior to signing this note.

## 2023-03-29 NOTE — Patient Instructions (Signed)
You have medial epicondylitis, also called golfers elbow You were given elbow compression sleeve and exercises today Referral placed to PT at [facility] I recommend topical Voltaren gel for pain Follow up if symptoms worsen or fail to improve

## 2023-04-03 ENCOUNTER — Other Ambulatory Visit: Payer: Self-pay

## 2023-04-03 ENCOUNTER — Encounter: Payer: Self-pay | Admitting: Physical Therapy

## 2023-04-03 ENCOUNTER — Ambulatory Visit: Payer: 59 | Attending: Sports Medicine | Admitting: Physical Therapy

## 2023-04-03 DIAGNOSIS — M6281 Muscle weakness (generalized): Secondary | ICD-10-CM | POA: Insufficient documentation

## 2023-04-03 DIAGNOSIS — M25521 Pain in right elbow: Secondary | ICD-10-CM | POA: Diagnosis present

## 2023-04-03 NOTE — Therapy (Signed)
OUTPATIENT PHYSICAL THERAPY UPPER EXTREMITY EVALUATION   Patient Name: Rebecca Browning MRN: [id number] DOB:05/12/1966, 57 y.o., female Today's Date: 04/03/2023  END OF SESSION:  PT End of Session - 04/03/23 1154     Visit Number 1    Date for PT Re-Evaluation 05/29/23    Authorization Type Aet[employer]    PT Start Time 1148    PT Stop Time 1230    PT Time Calculation (min) 42 min    Activity Tolerance Patient tolerated treatment well             Past Medical History:  Diagnosis Date   Abnormal Pap smear of cervix 10/17/2018    2020 ASCUS HPV HR+, 2021 neg HPV HR +   Elevated cholesterol    Tendon tear    Past Surgical History:  Procedure Laterality Date   COLPOSCOPY W/ BIOPSY / CURETTAGE  11/01/2018   ASCUS + HPV   DILATION AND CURETTAGE OF UTERUS     SAB   LIPOSUCTION Bilateral 08/19/14   thighs   POPLITEAL SYNOVIAL CYST EXCISION  1970's   Patient Active Problem List   Diagnosis Date Noted   Lateral epicondylitis of right elbow 02/21/2018     REFERRING PROVIDER: Reino Bellis DO  REFERRING DIAG: medial epicondylitis  THERAPY DIAG:  Medial   Rationale for Evaluation and Treatment: Rehabilitation  ONSET DATE: February 2024  SUBJECTIVE:                                                                                                                                                                                      SUBJECTIVE STATEMENT: Tennis 2-4x/week.  Medial elbow pain since February 1-2x week (unsure if it was new racquet; using elbow sleeve for tennis; had counter force brace but feels sleeve is more effective;  played tennis last night and tonight; altered swing; changing car gears straight drive push/pull No numbness/tingling Hand dominance: Right  PERTINENT HISTORY: Few years ago lateral elbow pain helped with DN (at [facility]) and exercise  PAIN:  PAIN:  Are you having pain? Yes NPRS scale: 4/10 Pain location: right medial elbow Aggravating  factors: playing tennis; push/pull with changing gears Relieving factors: elbow sleeve; been doing stretches prescribed by the doctor   PRECAUTIONS: None  WEIGHT BEARING RESTRICTIONS: No  FALLS:  Has patient fallen in last 6 months? 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  PLOF: Independent  PATIENT GOALS: Be able to continue to play tennis; alleviate pain  NEXT MD VISIT: as needed  OBJECTIVE:    PATIENT SURVEYS :  Quick Dash 18%  COGNITION: Overall cognitive status: Within  functional limits for tasks assessed      UPPER EXTREMITY ROM: All WNLs bilaterally  UPPER EXTREMITY MMT: Left WNLs 5/5 Right scapular and glenohumeral strength 5/5 Elbow flexion and extension 5/5 Pronation 4/5 painful  Supination 5 Wrist flexion 4+ Wrist extension 5 Grip: 60# left; 60# on right painful;  would expect dominant side to be 10# stronger than non dominant Pinch test:  12# left, 14# right   PALPATION:  Tenderness right medial epicondyle   TODAY'S TREATMENT:                                                                                                                                         DATE: 7/2  PATIENT EDUCATION: Education details: Educated patient on anatomy and physiology of current symptoms, prognosis, plan of care as well as initial self care strategies to promote recovery Person educated: Patient Education method: Explanation Education comprehension: verbalized understanding  HOME EXERCISE PROGRAM: Reviewed HEP provided by Dr. Margaretha Sheffield:  wrist flexor and extensor stretching, 1# (can) wrist flexor strengthening (discussed making it an eccentric lower for added benefit);  1# pronation/supination (showed how to make it an eccentric movement); finger abduction/adduction intrinsic using rubber band  Discussed general shoulder health as a tennis player encouraged prone Ys or Y lift offs on wall  ASSESSMENT:  CLINICAL  IMPRESSION: Patient is a 57 y.o. female who was seen today for physical therapy evaluation and treatment for right medial elbow pain which started in February. The patient plays tennis several  times a week (2-4x).  In February she did get a new tennis racquet with tighter strings but unsure if that was a contributing factor.  She continues to play tennis regularly (last night and tonight too) and uses an elbow sleeve (finds that is more helpful than a counter force brace).  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  Her Quick DASH score indicates mild difficulty with ADLs.  Decreased grip strength noted as well as wrist flexion and pronation/supination.  She would benefit from PT to address these limitations and deficits.     OBJECTIVE IMPAIRMENTS: decreased activity tolerance, decreased strength, impaired perceived functional ability, impaired UE functional use, and pain.   ACTIVITY LIMITATIONS: lifting and recreation  PARTICIPATION LIMITATIONS: driving and tennis  PERSONAL FACTORS: Time since onset of injury/illness/exacerbation are also affecting patient's functional outcome.   REHAB POTENTIAL: Good  CLINICAL DECISION MAKING: Stable/uncomplicated  EVALUATION COMPLEXITY: Low  GOALS: Goals reviewed with patient? Yes  SHORT TERM GOALS: Target date: 05/01/2023  The patient will demonstrate knowledge of basic self care strategies and exercises to promote healing  Baseline: Goal status: INITIAL  2.  The patient will report a 40% improvement in pain levels with functional activities which are currently  difficult including playing tennis and changing gears in straight drive Baseline:  Goal status: INITIAL  3.  Right grip strength improved to 65# of force  Baseline:  Goal status: INITIAL    LONG TERM GOALS: Target date:  05/29/2023    The patient will be independent in a safe self progression of a home exercise program to promote further recovery of function  Baseline:  Goal status: INITIAL  2.  The patient will report a 75% improvement in pain levels with functional activities which are currently difficult including playing tennis and changing gears Baseline:  Goal status: INITIAL  3.  Right wrist flexion and pronation/supination strength improved to 5-/5 and grip strength to 70# needed for tennis Baseline:  Goal status: INITIAL  4.  Quick DASH improved to 13%  Baseline:  Goal status: INITIAL   PLAN: PT FREQUENCY: 1-2x/week  PT DURATION: 8 weeks  PLANNED INTERVENTIONS: Therapeutic exercises, Therapeutic activity, Neuromuscular re-education, Patient/Family education, Self Care, Joint mobilization, Dry Needling, Electrical stimulation, Cryotherapy, Moist heat, Taping, Ultrasound, Ionotophoresis 4mg /ml Dexamethasone, Manual therapy, and Re-evaluation  PLAN FOR NEXT SESSION: ionto if cert signed; possible DN to proximal wrist flexors near insertion site (pt has had before to extensors) deferred today since playing tennis this evening; look at wrist position with holding racquet; eccentric wrist flexor strengthening; manual interventions  Lavinia Sharps, PT 04/03/23 1:14 PM Phone: [phone] Fax: [phone]

## 2023-04-10 ENCOUNTER — Ambulatory Visit: Payer: 59

## 2023-04-10 DIAGNOSIS — M25521 Pain in right elbow: Secondary | ICD-10-CM | POA: Diagnosis not present

## 2023-04-10 DIAGNOSIS — M6281 Muscle weakness (generalized): Secondary | ICD-10-CM

## 2023-04-10 NOTE — Therapy (Signed)
OUTPATIENT PHYSICAL THERAPY TREATMENT   Patient Name: Rebecca Browning MRN: [id number] DOB:1966-03-22, 57 y.o., female Today's Date: 04/10/2023  END OF SESSION:  PT End of Session - 04/10/23 1013     Visit Number 2    Date for PT Re-Evaluation 05/29/23    Authorization Type Aetna    PT Start Time [phone]    PT Stop Time 1010    PT Time Calculation (min) 36 min    Activity Tolerance Patient tolerated treatment well    Behavior During Therapy [facility] for tasks assessed/performed              Past Medical History:  Diagnosis Date   Abnormal Pap smear of cervix 10/17/2018    2020 ASCUS HPV HR+, 2021 neg HPV HR +   Elevated cholesterol    Tendon tear    Past Surgical History:  Procedure Laterality Date   COLPOSCOPY W/ BIOPSY / CURETTAGE  11/01/2018   ASCUS + HPV   DILATION AND CURETTAGE OF UTERUS     SAB   LIPOSUCTION Bilateral 08/19/14   thighs   POPLITEAL SYNOVIAL CYST EXCISION  1970's   Patient Active Problem List   Diagnosis Date Noted   Lateral epicondylitis of right elbow 02/21/2018     REFERRING PROVIDER: Reino Bellis DO  REFERRING DIAG: medial epicondylitis  THERAPY DIAG:  Medial   Rationale for Evaluation and Treatment: Rehabilitation  ONSET DATE: February 2024  SUBJECTIVE:                                                                                                                                                                                      SUBJECTIVE STATEMENT: Pain is always there and is aggravating.  It isn't extreme.  I am wearing a support sleeve when I play tennis.   No numbness/tingling Hand dominance: Right  PERTINENT HISTORY: Few years ago lateral elbow pain helped with DN (at [facility]) and exercise  PAIN:  PAIN:  Are you having pain? Yes NPRS scale: 2/10 Pain location: right medial elbow Aggravating factors: playing tennis; push/pull with changing gears Relieving factors: elbow sleeve; been doing stretches prescribed by  the doctor   PRECAUTIONS: None  WEIGHT BEARING RESTRICTIONS: No  FALLS:  Has patient fallen in last 6 months? 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  PLOF: Independent  PATIENT GOALS: Be able to continue to play tennis; alleviate pain  NEXT MD VISIT: as needed  OBJECTIVE:    PATIENT SURVEYS :  Quick Dash 18%  COGNITION: Overall cognitive status: Within functional limits for tasks assessed      UPPER EXTREMITY ROM:  All WNLs bilaterally  UPPER EXTREMITY MMT: Left WNLs 5/5 Right scapular and glenohumeral strength 5/5 Elbow flexion and extension 5/5 Pronation 4/5 painful  Supination 5 Wrist flexion 4+ Wrist extension 5 Grip: 60# left; 60# on right painful;  would expect dominant side to be 10# stronger than non dominant Pinch test:  12# left, 14# right   PALPATION:  Tenderness right medial epicondyle   TODAY'S TREATMENT:     DATE: 7/9:  Verbal review of all HEP PT educated pt in pain management strategies. Stretch before and after tennis, use counter brace for work and home tasks, ice after activity.    Manual:  Trigger Point Dry-Needling  Treatment instructions: Expect mild to moderate muscle soreness. S/S of pneumothorax if dry needled over a lung field, and to seek immediate medical attention should they occur. Patient verbalized understanding of these instructions and education.  Patient Consent Given: Yes Education handout provided: Previously provided Muscles treated: Lt distal triceps, wrist flexor muscles proximally Treatment response/outcome: Utilized skilled palpation to identify trigger points.  During dry needling able to palpate muscle twitch and muscle elongation  Elongation and release after DN, Cryostim to medial epicondyle Skilled palpation and monitoring by PT during dry needling                                                                                                                                     DATE: 7/2  PATIENT EDUCATION: Education details: Educated patient on anatomy and physiology of current symptoms, prognosis, plan of care as well as initial self care strategies to promote recovery Person educated: Patient Education method: Explanation Education comprehension: verbalized understanding  HOME EXERCISE PROGRAM: Reviewed HEP provided by Dr. Margaretha Sheffield:  wrist flexor and extensor stretching, 1# (can) wrist flexor strengthening (discussed making it an eccentric lower for added benefit);  1# pronation/supination (showed how to make it an eccentric movement); finger abduction/adduction intrinsic using rubber band  Discussed general shoulder health as a tennis player encouraged prone Ys or Y lift offs on wall  ASSESSMENT:  CLINICAL IMPRESSION: Pt has been consistent with her HEP issued by MD.  PT discussed strategies to reduce pain and repetition of the Rt elbow including use of counter brace, stretching, ice and reducing number of times playing tennis each week. Pt with good response to DN with twitch and improved tissue mobility after DN and manual therapy.   Patient will benefit from skilled PT to address the below impairments and improve overall function.      OBJECTIVE IMPAIRMENTS: decreased activity tolerance, decreased strength, impaired perceived functional ability, impaired UE functional use, and pain.   ACTIVITY LIMITATIONS: lifting and recreation  PARTICIPATION LIMITATIONS: driving and tennis  PERSONAL FACTORS: Time since onset of injury/illness/exacerbation are also affecting patient's functional outcome.   REHAB POTENTIAL: Good  CLINICAL DECISION MAKING: Stable/uncomplicated  EVALUATION COMPLEXITY: Low  GOALS: Goals reviewed with patient? Yes  SHORT TERM GOALS: Target date:  05/01/2023  The patient will demonstrate knowledge of basic self care strategies and exercises to promote healing  Baseline: Goal status: INITIAL  2.  The patient will report a 40%  improvement in pain levels with functional activities which are currently difficult including playing tennis and changing gears in straight drive Baseline:  Goal status: INITIAL  3.  Right grip strength improved to 65# of force  Baseline:  Goal status: INITIAL    LONG TERM GOALS: Target date: 05/29/2023    The patient will be independent in a safe self progression of a home exercise program to promote further recovery of function  Baseline:  Goal status: INITIAL  2.  The patient will report a 75% improvement in pain levels with functional activities which are currently difficult including playing tennis and changing gears Baseline:  Goal status: INITIAL  3.  Right wrist flexion and pronation/supination strength improved to 5-/5 and grip strength to 70# needed for tennis Baseline:  Goal status: INITIAL  4.  Quick DASH improved to 13%  Baseline:  Goal status: INITIAL   PLAN: PT FREQUENCY: 1-2x/week  PT DURATION: 8 weeks  PLANNED INTERVENTIONS: Therapeutic exercises, Therapeutic activity, Neuromuscular re-education, Patient/Family education, Self Care, Joint mobilization, Dry Needling, Electrical stimulation, Cryotherapy, Moist heat, Taping, Ultrasound, Ionotophoresis 4mg /ml Dexamethasone, Manual therapy, and Re-evaluation  PLAN FOR NEXT SESSION: ionto if cert signed; repeat DN if helpful, ice, progress HEP as needed  Lorrene Reid, PT 04/10/23 10:25 AM  Phone: [phone] Fax: [phone]

## 2023-04-17 ENCOUNTER — Ambulatory Visit: Payer: 59

## 2023-04-17 DIAGNOSIS — M25521 Pain in right elbow: Secondary | ICD-10-CM

## 2023-04-17 DIAGNOSIS — M6281 Muscle weakness (generalized): Secondary | ICD-10-CM

## 2023-04-17 NOTE — Patient Instructions (Signed)
IONTOPHORESIS PATIENT PRECAUTIONS & CONTRAINDICATIONS: ? ?Redness under one or both electrodes can occur.  This characterized by a uniform redness that usually disappears within 12 hours of treatment. ?Small pinhead size blisters may result in response to the drug.  Contact your physician if the problem persists more than 24 hours. ?On rare occasions, iontophoresis therapy can result in temporary skin reactions such as rash, inflammation, irritation or burns.  The skin reactions may be the result of individual sensitivity to the ionic solution used, the condition of the skin at the start of treatment, reaction to the materials in the electrodes, allergies or sensitivity to dexamethasone, or a poor connection between the patch and your skin.  Discontinue using iontophoresis if you have any of these reactions and report to your therapist. ?Remove the Patch? or electrodes if you have any undue sensation of pain or burning during the treatment and report discomfort to your therapist. ?Tell your Therapist if you have had known adverse reactions to the application of electrical current. ?If using the Patch?, the LED light will turn off when treatment is complete and the patch can be removed.  Approximate treatment time is 1-3 hours.  Remove the patch when light goes off or after 6 hours. ?The Patch? can be worn during normal activity, however excessive motion where the electrodes have been placed can cause poor contact between the skin and the electrode or uneven electrical current resulting in greater risk of skin irritation. ?Keep out of the reach of children. ? ? ?DO NOT use if you have a cardiac pacemaker or any other electrically sensitive implanted device. ?DO NOT use if you have a known sensitivity to dexamethasone. ?DO NOT use during Magnetic Resonance Imaging (MRI). ?DO NOT use over broken or compromised skin (e.g. sunburn, cuts, or acne) due to the increased risk of skin reaction. ?DO NOT SHAVE over the area to  be treated:  To establish good contact between the Patch? and the skin, excessive hair may be clipped. ?DO NOT place the Patch? or electrodes on or over your eyes, directly over your heart, or brain. ?DO NOT reuse the Patch? or electrodes as this may cause burns to occur.  ?Librarian, [employer] Services ?[address], [address] ?[place], [place] [address] ?Phone # [phone] ?Fax [phone]  ?

## 2023-04-17 NOTE — Therapy (Signed)
OUTPATIENT PHYSICAL THERAPY TREATMENT   Patient Name: Rebecca Browning MRN: [id number] DOB:November 16, 1965, 57 y.o., female Today's Date: 04/17/2023  END OF SESSION:  PT End of Session - 04/17/23 0839     Visit Number 3    Date for PT Re-Evaluation 05/29/23    Authorization Type Aet[employer]    PT Start Time 0803    PT Stop Time 0835    PT Time Calculation (min) 32 min    Activity Tolerance Patient tolerated treatment well    Behavior During Therapy [facility] for tasks assessed/performed               Past Medical History:  Diagnosis Date   Abnormal Pap smear of cervix 10/17/2018    2020 ASCUS HPV HR+, 2021 neg HPV HR +   Elevated cholesterol    Tendon tear    Past Surgical History:  Procedure Laterality Date   COLPOSCOPY W/ BIOPSY / CURETTAGE  11/01/2018   ASCUS + HPV   DILATION AND CURETTAGE OF UTERUS     SAB   LIPOSUCTION Bilateral 08/19/14   thighs   POPLITEAL SYNOVIAL CYST EXCISION  1970's   Patient Active Problem List   Diagnosis Date Noted   Lateral epicondylitis of right elbow 02/21/2018     REFERRING PROVIDER: Reino Bellis DO  REFERRING DIAG: medial epicondylitis  THERAPY DIAG:  Medial   Rationale for Evaluation and Treatment: Rehabilitation  ONSET DATE: February 2024  SUBJECTIVE:                                                                                                                                                                                      SUBJECTIVE STATEMENT: My Rt elbow feels a little better because I haven't been playing tennis.  I am doing the exercises regularly.   No numbness/tingling Hand dominance: Right  PERTINENT HISTORY: Few years ago lateral elbow pain helped with DN (at [facility]) and exercise  PAIN:  PAIN:  Are you having pain? Yes NPRS scale: 0-2/10 Pain location: right medial elbow Aggravating factors: using Rt UE Relieving factors: elbow sleeve; been doing stretches prescribed by the doctor   PRECAUTIONS:  None  WEIGHT BEARING RESTRICTIONS: No  FALLS:  Has patient fallen in last 6 months? 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  PLOF: Independent  PATIENT GOALS: Be able to continue to play tennis; alleviate pain  NEXT MD VISIT: as needed  OBJECTIVE:    PATIENT SURVEYS :  Quick Dash 18%  COGNITION: Overall cognitive status: Within functional limits for tasks assessed      UPPER EXTREMITY ROM: All WNLs bilaterally  UPPER EXTREMITY MMT: Left WNLs 5/5 Right scapular and glenohumeral strength 5/5 Elbow flexion and extension 5/5 Pronation 4/5 painful  Supination 5 Wrist flexion 4+ Wrist extension 5 Grip: 60# left; 60# on right painful;  would expect dominant side to be 10# stronger than non dominant Pinch test:  12# left, 14# right   PALPATION:  Tenderness right medial epicondyle   TODAY'S TREATMENT:    DATE: 7/16:  Discussed HEP and use of ice after playing tennis or repetitive use.   Manual:  Trigger Point Dry-Needling  Treatment instructions: Expect mild to moderate muscle soreness. S/S of pneumothorax if dry needled over a lung field, and to seek immediate medical attention should they occur. Patient verbalized understanding of these instructions and education.  Patient Consent Given: Yes Education handout provided: Previously provided Muscles treated: Lt distal triceps, wrist flexor muscles proximally Treatment response/outcome: Utilized skilled palpation to identify trigger points.  During dry needling able to palpate muscle twitch and muscle elongation  Elongation and release after DN cross friction of proximal wrist flexors and distal triceps, Cryostim to medial epicondyle Skilled palpation and monitoring by PT during dry needling         Ionto #1-1.0 cc dex to Rt medial elbow 4 hour time- instructions provided.                                                                       DATE: 7/9:  Verbal review of all HEP PT  educated pt in pain management strategies. Stretch before and after tennis, use counter brace for work and home tasks, ice after activity.    Manual:  Trigger Point Dry-Needling  Treatment instructions: Expect mild to moderate muscle soreness. S/S of pneumothorax if dry needled over a lung field, and to seek immediate medical attention should they occur. Patient verbalized understanding of these instructions and education.  Patient Consent Given: Yes Education handout provided: Previously provided Muscles treated: Lt distal triceps, wrist flexor muscles proximally Treatment response/outcome: Utilized skilled palpation to identify trigger points.  During dry needling able to palpate muscle twitch and muscle elongation  Elongation and release after DN including , Cryostim to medial epicondyle Skilled palpation and monitoring by PT during dry needling                                                                                                                                    DATE: 7/2  PATIENT EDUCATION: Education details: Educated patient on anatomy and physiology of current symptoms, prognosis, plan of care as well as initial self care strategies to promote recovery Person educated: Patient Education method: Explanation Education comprehension: verbalized understanding  HOME EXERCISE PROGRAM: Reviewed  HEP provided by Dr. Margaretha Sheffield:  wrist flexor and extensor stretching, 1# (can) wrist flexor strengthening (discussed making it an eccentric lower for added benefit);  1# pronation/supination (showed how to make it an eccentric movement); finger abduction/adduction intrinsic using rubber band  Discussed general shoulder health as a tennis player encouraged prone Ys or Y lift offs on wall  ASSESSMENT:  CLINICAL IMPRESSION: Pt has been consistent with her HEP issued by MD including flexibility and gentle strength. Pt is using strategies to reduce inflammation and pain including, stretching, ice  and reducing number of times playing tennis each week. Pt with good response to DN with twitch and improved tissue mobility after DN and manual therapy.  Ionto #1 applied today and pt provided with care instructions.  Patient will benefit from skilled PT to address the below impairments and improve overall function.      OBJECTIVE IMPAIRMENTS: decreased activity tolerance, decreased strength, impaired perceived functional ability, impaired UE functional use, and pain.   ACTIVITY LIMITATIONS: lifting and recreation  PARTICIPATION LIMITATIONS: driving and tennis  PERSONAL FACTORS: Time since onset of injury/illness/exacerbation are also affecting patient's functional outcome.   REHAB POTENTIAL: Good  CLINICAL DECISION MAKING: Stable/uncomplicated  EVALUATION COMPLEXITY: Low  GOALS: Goals reviewed with patient? Yes  SHORT TERM GOALS: Target date: 05/01/2023  The patient will demonstrate knowledge of basic self care strategies and exercises to promote healing  Baseline: resting from tennis, stretching and gentle strength, wearing brace (04/17/23) Goal status: MET  2.  The patient will report a 40% improvement in pain levels with functional activities which are currently difficult including playing tennis and changing gears in straight drive Baseline:  Goal status: INITIAL  3.  Right grip strength improved to 65# of force  Baseline:  Goal status: INITIAL    LONG TERM GOALS: Target date: 05/29/2023    The patient will be independent in a safe self progression of a home exercise program to promote further recovery of function  Baseline:  Goal status: INITIAL  2.  The patient will report a 75% improvement in pain levels with functional activities which are currently difficult including playing tennis and changing gears Baseline:  Goal status: INITIAL  3.  Right wrist flexion and pronation/supination strength improved to 5-/5 and grip strength to 70# needed for tennis Baseline:   Goal status: INITIAL  4.  Quick DASH improved to 13%  Baseline:  Goal status: INITIAL   PLAN: PT FREQUENCY: 1-2x/week  PT DURATION: 8 weeks  PLANNED INTERVENTIONS: Therapeutic exercises, Therapeutic activity, Neuromuscular re-education, Patient/Family education, Self Care, Joint mobilization, Dry Needling, Electrical stimulation, Cryotherapy, Moist heat, Taping, Ultrasound, Ionotophoresis 4mg /ml Dexamethasone, Manual therapy, and Re-evaluation  PLAN FOR NEXT SESSION: ionto #2; repeat DN if helpful, ice, progress HEP as needed.  Test grip strength for STG.  Lorrene Reid, PT 04/17/23 8:43 AM  Phone: [phone] Fax: [phone]

## 2023-04-24 ENCOUNTER — Ambulatory Visit: Payer: 59 | Admitting: Physical Therapy

## 2023-04-24 DIAGNOSIS — M25521 Pain in right elbow: Secondary | ICD-10-CM | POA: Diagnosis not present

## 2023-04-24 DIAGNOSIS — M6281 Muscle weakness (generalized): Secondary | ICD-10-CM

## 2023-04-24 NOTE — Therapy (Signed)
OUTPATIENT PHYSICAL THERAPY TREATMENT   Patient Name: Rebecca Browning MRN: [id number] DOB:Apr 15, 1966, 57 y.o., female Today's Date: 04/24/2023  END OF SESSION:  PT End of Session - 04/24/23 0851     Visit Number 4    Date for PT Re-Evaluation 05/29/23    Authorization Type Aetna    PT Start Time [phone]    PT Stop Time 0930    PT Time Calculation (min) 39 min    Activity Tolerance Patient tolerated treatment well               Past Medical History:  Diagnosis Date   Abnormal Pap smear of cervix 10/17/2018    2020 ASCUS HPV HR+, 2021 neg HPV HR +   Elevated cholesterol    Tendon tear    Past Surgical History:  Procedure Laterality Date   COLPOSCOPY W/ BIOPSY / CURETTAGE  11/01/2018   ASCUS + HPV   DILATION AND CURETTAGE OF UTERUS     SAB   LIPOSUCTION Bilateral 08/19/14   thighs   POPLITEAL SYNOVIAL CYST EXCISION  1970's   Patient Active Problem List   Diagnosis Date Noted   Lateral epicondylitis of right elbow 02/21/2018     REFERRING PROVIDER: Reino Bellis DO  REFERRING DIAG: medial epicondylitis  THERAPY DIAG:  Medial   Rationale for Evaluation and Treatment: Rehabilitation  ONSET DATE: February 2024  SUBJECTIVE:                                                                                                                                                                                      SUBJECTIVE STATEMENT: I'm getting better.  I had DN twice but I don't think I need it today since I'm feeling pretty good.  I took a week off from tennis but played last night.  Wore the sleeve.  I've adjusted my swing.   No numbness/tingling Hand dominance: Right  PERTINENT HISTORY: Few years ago lateral elbow pain helped with DN (at [facility]) and exercise  PAIN:  PAIN:  Are you having pain? Yes NPRS scale: 1/10 Pain location: right medial elbow Aggravating factors: using Rt UE Relieving factors: elbow sleeve; been doing stretches prescribed by the  doctor   PRECAUTIONS: None  WEIGHT BEARING RESTRICTIONS: No  FALLS:  Has patient fallen in last 6 months? 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  PLOF: Independent  PATIENT GOALS: Be able to continue to play tennis; alleviate pain  NEXT MD VISIT: as needed  OBJECTIVE:    PATIENT SURVEYS :  Quick Dash 18%  COGNITION: Overall cognitive status: Within functional limits for tasks assessed  UPPER EXTREMITY ROM: All WNLs bilaterally  UPPER EXTREMITY MMT: Left WNLs 5/5 Right scapular and glenohumeral strength 5/5 Elbow flexion and extension 5/5 Pronation 4/5 painful  Supination 5 Wrist flexion 4+ Wrist extension 5 Grip: 60# left; 60# on right painful;  would expect dominant side to be 10# stronger than non dominant Pinch test:  12# left, 14# right   PALPATION:  Tenderness right medial epicondyle   TODAY'S TREATMENT:    DATE: 7/23:  2# wrist flexion eccentric lowers 10x2 2# pronation 10x 2 Red FlexBar eccentric flexion 10x2 Red FlexBar pronation 10x2 Red FlexBar wrist flexion 10x2  Ionto #1-1.0 cc dex to Rt medial elbow 4 hour time- instructions provided.     Reviewed the importance of shoulder strengthening as well  DATE: 7/16:  Discussed HEP and use of ice after playing tennis or repetitive use.   Manual:  Trigger Point Dry-Needling  Treatment instructions: Expect mild to moderate muscle soreness. S/S of pneumothorax if dry needled over a lung field, and to seek immediate medical attention should they occur. Patient verbalized understanding of these instructions and education.  Patient Consent Given: Yes Education handout provided: Previously provided Muscles treated: Lt distal triceps, wrist flexor muscles proximally Treatment response/outcome: Utilized skilled palpation to identify trigger points.  During dry needling able to palpate muscle twitch and muscle elongation  Elongation and release after DN cross  friction of proximal wrist flexors and distal triceps, Cryostim to medial epicondyle Skilled palpation and monitoring by PT during dry needling         Ionto #1-1.0 cc dex to Rt medial elbow 4 hour time- instructions provided.                                                                       DATE: 7/9:  Verbal review of all HEP PT educated pt in pain management strategies. Stretch before and after tennis, use counter brace for work and home tasks, ice after activity.    Manual:  Trigger Point Dry-Needling  Treatment instructions: Expect mild to moderate muscle soreness. S/S of pneumothorax if dry needled over a lung field, and to seek immediate medical attention should they occur. Patient verbalized understanding of these instructions and education.  Patient Consent Given: Yes Education handout provided: Previously provided Muscles treated: Lt distal triceps, wrist flexor muscles proximally Treatment response/outcome: Utilized skilled palpation to identify trigger points.  During dry needling able to palpate muscle twitch and muscle elongation  Elongation and release after DN including , Cryostim to medial epicondyle Skilled palpation and monitoring by PT during dry needling  PATIENT EDUCATION: Education details: Educated patient on anatomy and physiology of current symptoms, prognosis, plan of care as well as initial self care strategies to promote recovery Person educated: Patient Education method: Explanation Education comprehension: verbalized understanding  HOME EXERCISE PROGRAM: Reviewed HEP provided by Dr. Margaretha Sheffield:  wrist flexor and extensor stretching, 1# (can) wrist flexor strengthening (discussed making it an eccentric lower for added benefit);  1# pronation/supination (showed how to make it an eccentric movement); finger abduction/adduction  intrinsic using rubber band  Discussed general shoulder health as a tennis player encouraged prone Ys or Y lift offs on wall  ASSESSMENT:  CLINICAL IMPRESSION: Progressed wrist flexor and pronation strengthening ex's including more eccentric strengthening.  Verbal cues to avoid compensatory shoulder movement particularly with pronation.  Symptoms remain low level throughout session.  Therapist monitoring response to all interventions and modifying treatment accordingly.    OBJECTIVE IMPAIRMENTS: decreased activity tolerance, decreased strength, impaired perceived functional ability, impaired UE functional use, and pain.   ACTIVITY LIMITATIONS: lifting and recreation  PARTICIPATION LIMITATIONS: driving and tennis  PERSONAL FACTORS: Time since onset of injury/illness/exacerbation are also affecting patient's functional outcome.   REHAB POTENTIAL: Good  CLINICAL DECISION MAKING: Stable/uncomplicated  EVALUATION COMPLEXITY: Low  GOALS: Goals reviewed with patient? Yes  SHORT TERM GOALS: Target date: 05/01/2023  The patient will demonstrate knowledge of basic self care strategies and exercises to promote healing  Baseline: resting from tennis, stretching and gentle strength, wearing brace (04/17/23) Goal status: MET  2.  The patient will report a 40% improvement in pain levels with functional activities which are currently difficult including playing tennis and changing gears in straight drive Baseline:  Goal status: INITIAL  3.  Right grip strength improved to 65# of force  Baseline:  Goal status: INITIAL    LONG TERM GOALS: Target date: 05/29/2023    The patient will be independent in a safe self progression of a home exercise program to promote further recovery of function  Baseline:  Goal status: INITIAL  2.  The patient will report a 75% improvement in pain levels with functional activities which are currently difficult including playing tennis and changing  gears Baseline:  Goal status: INITIAL  3.  Right wrist flexion and pronation/supination strength improved to 5-/5 and grip strength to 70# needed for tennis Baseline:  Goal status: INITIAL  4.  Quick DASH improved to 13%  Baseline:  Goal status: INITIAL   PLAN: PT FREQUENCY: 1-2x/week  PT DURATION: 8 weeks  PLANNED INTERVENTIONS: Therapeutic exercises, Therapeutic activity, Neuromuscular re-education, Patient/Family education, Self Care, Joint mobilization, Dry Needling, Electrical stimulation, Cryotherapy, Moist heat, Taping, Ultrasound, Ionotophoresis 4mg /ml Dexamethasone, Manual therapy, and Re-evaluation  PLAN FOR NEXT SESSION: ionto; repeat DN as needed; ice, progress HEP; Test grip strength for STG.   Lavinia Sharps, PT 04/24/23 8:00 PM Phone: [phone] Fax: [phone]

## 2023-05-03 ENCOUNTER — Ambulatory Visit: Payer: 59 | Attending: Sports Medicine | Admitting: Physical Therapy

## 2023-05-03 DIAGNOSIS — M25521 Pain in right elbow: Secondary | ICD-10-CM | POA: Diagnosis present

## 2023-05-03 DIAGNOSIS — M6281 Muscle weakness (generalized): Secondary | ICD-10-CM | POA: Insufficient documentation

## 2023-05-03 NOTE — Therapy (Signed)
OUTPATIENT PHYSICAL THERAPY TREATMENT   Patient Name: Rebecca Browning MRN: [id number] DOB:1966/05/26, 57 y.o., female Today's Date: 05/03/2023  END OF SESSION:  PT End of Session - 05/03/23 0847     Visit Number 5    Date for PT Re-Evaluation 05/29/23    Authorization Type [employer]    PT Start Time 0848    PT Stop Time 0926    PT Time Calculation (min) 38 min    Activity Tolerance Patient tolerated treatment well               Past Medical History:  Diagnosis Date   Abnormal Pap smear of cervix 10/17/2018    2020 ASCUS HPV HR+, 2021 neg HPV HR +   Elevated cholesterol    Tendon tear    Past Surgical History:  Procedure Laterality Date   COLPOSCOPY W/ BIOPSY / CURETTAGE  11/01/2018   ASCUS + HPV   DILATION AND CURETTAGE OF UTERUS     SAB   LIPOSUCTION Bilateral 08/19/14   thighs   POPLITEAL SYNOVIAL CYST EXCISION  1970's   Patient Active Problem List   Diagnosis Date Noted   Lateral epicondylitis of right elbow 02/21/2018     REFERRING PROVIDER: Reino Bellis DO  REFERRING DIAG: medial epicondylitis  THERAPY DIAG:  Medial   Rationale for Evaluation and Treatment: Rehabilitation  ONSET DATE: February 2024  SUBJECTIVE:                                                                                                                                                                                      SUBJECTIVE STATEMENT: It's still sore.  Played tennis with the sleeve.  It was OK.   Hand dominance: Right  PERTINENT HISTORY: Few years ago lateral elbow pain helped with DN (at [facility]) and exercise  PAIN:  PAIN:  Are you having pain? Yes NPRS scale: 1/10 Pain location: right medial elbow Aggravating factors: using Rt UE Relieving factors: elbow sleeve; been doing stretches prescribed by the doctor   PRECAUTIONS: None  WEIGHT BEARING RESTRICTIONS: No  FALLS:  Has patient fallen in last 6 months? 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  PLOF: Independent  PATIENT GOALS: Be able to continue to play tennis; alleviate pain  NEXT MD VISIT: as needed  OBJECTIVE:    PATIENT SURVEYS :  Quick Dash 18%  COGNITION: Overall cognitive status: Within functional limits for tasks assessed      UPPER EXTREMITY ROM: All WNLs bilaterally  UPPER EXTREMITY MMT: Left WNLs 5/5 Right scapular and glenohumeral strength 5/5 Elbow flexion and extension 5/5 Pronation 4/5  painful  Supination 5 Wrist flexion 4+ Wrist extension 5 Grip: 60# left; 60# on right painful;  would expect dominant side to be 10# stronger than non dominant Pinch test:  12# left, 14# right   PALPATION:  Tenderness right medial epicondyle   TODAY'S TREATMENT:    DATE: 8/1:  Red band combo: wrist flexion, slow lower  then pronation then slow return (eccentrics) 3 sets of 10; 1 minute rest between sets added to HEP to be done 3-4x/week Discussion of effect of eccentrics to promote healing Ionto #3-1.0 cc dex to Rt medial elbow 4 hour time- instructions provided.   Manual: soft tissue mobilization to wrist flexors, pronators Trigger Point Dry-Needling  Treatment instructions: Expect mild to moderate muscle soreness. S/S of pneumothorax if dry needled over a lung field, and to seek immediate medical attention should they occur. Patient verbalized understanding of these instructions and education.  Patient Consent Given: Yes Education handout provided: Previously provided Muscles treated:right wrist flexors and pronators Treatment response/outcome: Utilized skilled palpation to identify trigger points.  During dry needling able to palpate muscle twitch and muscle elongation    DATE: 7/23:  2# wrist flexion eccentric lowers 10x2 2# pronation 10x 2 Red FlexBar eccentric flexion 10x2 Red FlexBar pronation 10x2 Red FlexBar wrist flexion 10x2  Ionto #2-1.0 cc dex to Rt medial elbow 4 hour time- instructions provided.      Reviewed the importance of shoulder strengthening as well  DATE: 7/16:  Discussed HEP and use of ice after playing tennis or repetitive use.   Manual:  Trigger Point Dry-Needling  Treatment instructions: Expect mild to moderate muscle soreness. S/S of pneumothorax if dry needled over a lung field, and to seek immediate medical attention should they occur. Patient verbalized understanding of these instructions and education.  Patient Consent Given: Yes Education handout provided: Previously provided Muscles treated: Lt distal triceps, wrist flexor muscles proximally Treatment response/outcome: Utilized skilled palpation to identify trigger points.  During dry needling able to palpate muscle twitch and muscle elongation  Elongation and release after DN cross friction of proximal wrist flexors and distal triceps, Cryostim to medial epicondyle Skilled palpation and monitoring by PT during dry needling         Ionto #1-1.0 cc dex to Rt medial elbow 4 hour time- instructions provided.                                                                                                                                             PATIENT EDUCATION: Education details: Educated patient on anatomy and physiology of current symptoms, prognosis, plan of care as well as initial self care strategies to promote recovery Person educated: Patient Education method: Explanation Education comprehension: verbalized understanding  HOME EXERCISE PROGRAM: Reviewed HEP provided by Dr. 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  Pain level remains at a lower intensity.  Responds well to DN of wrist flexors and pronators and ionto to proximal muscular attachments at the medial epicondyle.  Therapist monitoring response throughout treatment session.     OBJECTIVE IMPAIRMENTS: decreased activity tolerance, decreased strength, impaired perceived functional ability, impaired UE functional use, and pain.   ACTIVITY LIMITATIONS: lifting and recreation  PARTICIPATION LIMITATIONS: driving and tennis  PERSONAL FACTORS: Time since onset of injury/illness/exacerbation are also affecting patient's functional outcome.   REHAB POTENTIAL: Good  CLINICAL DECISION MAKING: Stable/uncomplicated  EVALUATION COMPLEXITY: Low  GOALS: Goals reviewed with patient? Yes  SHORT TERM GOALS: Target date: 05/01/2023  The patient will demonstrate knowledge of basic self care strategies and exercises to promote healing  Baseline: resting from tennis, stretching and gentle strength, wearing brace (04/17/23) Goal status: MET  2.  The patient will report a 40% improvement in pain levels with functional activities which are currently difficult including playing tennis and changing gears in straight drive Baseline:  Goal status: met 8/1  3.  Right grip strength improved to 65# of force  Baseline:  Goal status: ongoing    LONG TERM GOALS: Target date: 05/29/2023    The patient will be independent in a safe self progression of a home exercise program to promote further recovery of function  Baseline:  Goal status: INITIAL  2.  The patient will report a 75%  improvement in pain levels with functional activities which are currently difficult including playing tennis and changing gears Baseline:  Goal status: INITIAL  3.  Right wrist flexion and pronation/supination strength improved to 5-/5 and grip strength to 70# needed for tennis Baseline:  Goal status: INITIAL  4.  Quick DASH improved to 13%  Baseline:  Goal status: INITIAL   PLAN: PT FREQUENCY: 1-2x/week  PT DURATION: 8 weeks  PLANNED INTERVENTIONS: Therapeutic exercises, Therapeutic activity, Neuromuscular re-education, Patient/Family education, Self Care, Joint mobilization, Dry Needling, Electrical stimulation, Cryotherapy, Moist heat, Taping, Ultrasound, Ionotophoresis 4mg /ml Dexamethasone, Manual therapy, and Re-evaluation  PLAN FOR NEXT SESSION:Test grip strength for STG.  ionto; repeat DN as needed; ice, progress HEP;   Lavinia Sharps, PT 05/03/23 10:19 AM Phone: [phone] Fax: [phone]

## 2023-05-08 ENCOUNTER — Ambulatory Visit: Payer: 59

## 2023-05-08 DIAGNOSIS — M25521 Pain in right elbow: Secondary | ICD-10-CM | POA: Diagnosis not present

## 2023-05-08 DIAGNOSIS — M6281 Muscle weakness (generalized): Secondary | ICD-10-CM

## 2023-05-08 NOTE — Therapy (Signed)
OUTPATIENT PHYSICAL THERAPY TREATMENT   Patient Name: Rebecca Browning MRN: [id number] DOB:April 04, 1966, 57 y.o., female Today's Date: 05/08/2023  END OF SESSION:  PT End of Session - 05/08/23 0835     Visit Number 6    Date for PT Re-Evaluation 05/29/23    Authorization Type [employer]et[employer]    PT Start Time 0805    PT Stop Time 0836    PT Time Calculation (min) 31 min    Activity Tolerance Patient tolerated treatment well    Behavior During Therapy [facility] for tasks assessed/performed                Past Medical History:  Diagnosis Date   Abnormal Pap smear of cervix 10/17/2018    2020 ASCUS HPV HR+, 2021 neg HPV HR +   Elevated cholesterol    Tendon tear    Past Surgical History:  Procedure Laterality Date   COLPOSCOPY W/ BIOPSY / CURETTAGE  11/01/2018   ASCUS + HPV   DILATION AND CURETTAGE OF UTERUS     SAB   LIPOSUCTION Bilateral 08/19/14   thighs   POPLITEAL SYNOVIAL CYST EXCISION  1970's   Patient Active Problem List   Diagnosis Date Noted   Lateral epicondylitis of right elbow 02/21/2018     REFERRING PROVIDER: Reino Bellis DO  REFERRING DIAG: medial epicondylitis  THERAPY DIAG:  Medial   Rationale for Evaluation and Treatment: Rehabilitation  ONSET DATE: February 2024  SUBJECTIVE:                                                                                                                                                                                      SUBJECTIVE STATEMENT: I think PT is helping some. I haven't been playing tennis as much.  Last time I played was 1.5 weeks ago and I wore the sleeve.   Hand dominance: Right  PERTINENT HISTORY: Few years ago lateral elbow pain helped with DN (at [facility]) and exercise  PAIN:  PAIN:  Are you having pain? Yes NPRS scale: 1-2/10 Pain location: right medial elbow Aggravating factors: using Rt UE Relieving factors: elbow sleeve; been doing stretches prescribed by the doctor   PRECAUTIONS:  None  WEIGHT BEARING RESTRICTIONS: No  FALLS:  Has patient fallen in last 6 months? 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  PLOF: Independent  PATIENT GOALS: Be able to continue to play tennis; alleviate pain  NEXT MD VISIT: as needed  OBJECTIVE:    PATIENT SURVEYS :  Quick Dash 18%  COGNITION: Overall cognitive status: Within functional limits for tasks assessed      UPPER  EXTREMITY ROM: All WNLs bilaterally  UPPER EXTREMITY MMT: Left WNLs 5/5 Right scapular and glenohumeral strength 5/5 Elbow flexion and extension 5/5 Pronation 4/5 painful  Supination 5 Wrist flexion 4+ Wrist extension 5 Grip: 60# left; 60# on right painful;  would expect dominant side to be 10# stronger than non dominant Pinch test:  12# left, 14# right 05/08/23: 77# on Rt- no pain   PALPATION:  Tenderness right medial epicondyle   TODAY'S TREATMENT:    DATE: 05/10/23:  Discussed sleeve wear, eccentrics and tennis Ionto #4-1.0 cc dex to Rt medial elbow 4 hour time- instructions provided.   Manual: soft tissue mobilization to wrist flexors, pronators Trigger Point Dry-Needling  Treatment instructions: Expect mild to moderate muscle soreness. S/S of pneumothorax if dry needled over a lung field, and to seek immediate medical attention should they occur. Patient verbalized understanding of these instructions and education.  Patient Consent Given: Yes Education handout provided: Previously provided Muscles treated:right wrist flexors and pronators Treatment response/outcome: Utilized skilled palpation to identify trigger points.  During dry needling able to palpate muscle twitch and muscle elongation  DATE: 8/1:  Red band combo: wrist flexion, slow lower  then pronation then slow return (eccentrics) 3 sets of 10; 1 minute rest between sets added to HEP to be done 3-4x/week Discussion of effect of eccentrics to promote healing Ionto #3-1.0 cc dex to Rt medial  elbow 4 hour time- instructions provided.   Manual: soft tissue mobilization to wrist flexors, pronators Trigger Point Dry-Needling  Treatment instructions: Expect mild to moderate muscle soreness. S/S of pneumothorax if dry needled over a lung field, and to seek immediate medical attention should they occur. Patient verbalized understanding of these instructions and education.  Patient Consent Given: Yes Education handout provided: Previously provided Muscles treated:right wrist flexors and pronators Treatment response/outcome: Utilized skilled palpation to identify trigger points.  During dry needling able to palpate muscle twitch and muscle elongation    DATE: 7/23:  2# wrist flexion eccentric lowers 10x2 2# pronation 10x 2 Red FlexBar eccentric flexion 10x2 Red FlexBar pronation 10x2 Red FlexBar wrist flexion 10x2  Ionto #2-1.0 cc dex to Rt medial elbow 4 hour time- instructions provided.     Reviewed the importance of shoulder strengthening as well  DATE: 7/16:  Discussed HEP and use of ice after playing tennis or repetitive use.   Manual:  Trigger Point Dry-Needling  Treatment instructions: Expect mild to moderate muscle soreness. S/S of pneumothorax if dry needled over a lung field, and to seek immediate medical attention should they occur. Patient verbalized understanding of these instructions and education.  Patient Consent Given: Yes Education handout provided: Previously provided Muscles treated: Lt distal triceps, wrist flexor muscles proximally Treatment response/outcome: Utilized skilled palpation to identify trigger points.  During dry needling able to palpate muscle twitch and muscle elongation  Elongation and release after DN cross friction of proximal wrist flexors and distal triceps, Cryostim to medial epicondyle Skilled palpation and monitoring by PT during dry needling         Ionto #1-1.0 cc dex to Rt medial elbow 4 hour time- instructions provided.  PATIENT EDUCATION: Education details: Educated patient on anatomy and physiology of current symptoms, prognosis, plan of care as well as initial self care strategies to promote recovery Person educated: Patient Education method: Explanation Education comprehension: verbalized understanding  HOME EXERCISE PROGRAM: Reviewed HEP provided by Dr. 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  She played tennis 1.5 weeks ago without significant increase in pain.  Rt grip strength is improved to 77# without pain.  Pt with good response to DN and PT noted fewer trigger points overall.  Pt is compliant with HEP for eccentric strength and no further advancement is needed at this time.  Pt will play tennis tomorrow and note how she feels after this.  1-2 more sessions probable.  Patient will benefit from skilled PT to address the below impairments and improve overall function.     OBJECTIVE IMPAIRMENTS: decreased activity tolerance, decreased strength, impaired perceived functional ability, impaired UE functional use, and pain.   ACTIVITY LIMITATIONS: lifting and  recreation  PARTICIPATION LIMITATIONS: driving and tennis  PERSONAL FACTORS: Time since onset of injury/illness/exacerbation are also affecting patient's functional outcome.   REHAB POTENTIAL: Good  CLINICAL DECISION MAKING: Stable/uncomplicated  EVALUATION COMPLEXITY: Low  GOALS: Goals reviewed with patient? Yes  SHORT TERM GOALS: Target date: 05/01/2023  The patient will demonstrate knowledge of basic self care strategies and exercises to promote healing  Baseline: resting from tennis, stretching and gentle strength, wearing brace (04/17/23) Goal status: MET  2.  The patient will report a 40% improvement in pain levels with functional activities which are currently difficult including playing tennis and changing gears in straight drive Baseline: 64% (12/03/27) Goal status: met   3.  Right grip strength improved to 65# of force  Baseline: 77# -05/08/23 Goal status: MET    LONG TERM GOALS: Target date: 05/29/2023    The patient will be independent in a safe self progression of a home exercise program to promote further recovery of function  Baseline:  Goal status: INITIAL  2.  The patient will report a 75% improvement in pain levels with functional activities which are currently difficult including playing tennis and changing gears Baseline: 40% (05/08/23) Goal status: INITIAL  3.  Right wrist flexion and pronation/supination strength improved to 5-/5 and grip strength to 70# needed for tennis Baseline: grip is 77# (05/08/23) Goal status: IN PROGRESS  4.  Quick DASH improved to 13%  Baseline:  Goal status: INITIAL   PLAN: PT FREQUENCY: 1-2x/week  PT DURATION: 8 weeks  PLANNED INTERVENTIONS: Therapeutic exercises, Therapeutic activity, Neuromuscular re-education, Patient/Family education, Self Care, Joint mobilization, Dry Needling, Electrical stimulation, Cryotherapy, Moist heat, Taping, Ultrasound, Ionotophoresis 4mg /ml Dexamethasone, Manual therapy, and  Re-evaluation  PLAN FOR NEXT SESSION: ionto; repeat DN as needed; ice, progress HEP; 1-2 more sessions probable.   Tresa Endo  Donalee Citrin, PT 05/08/23 8:44 AM  Phone: [phone] Fax: [phone]

## 2023-05-11 ENCOUNTER — Other Ambulatory Visit: Payer: Self-pay | Admitting: Internal Medicine

## 2023-05-11 DIAGNOSIS — Z1231 Encounter for screening mammogram for malignant neoplasm of breast: Secondary | ICD-10-CM

## 2023-05-15 ENCOUNTER — Ambulatory Visit: Payer: 59 | Admitting: Physical Therapy

## 2023-05-16 ENCOUNTER — Ambulatory Visit
Admission: RE | Admit: 2023-05-16 | Discharge: 2023-05-16 | Disposition: A | Discharge status: Home / Self Care | Payer: 59 | Source: Ambulatory Visit | Attending: Internal Medicine | Admitting: Internal Medicine

## 2023-05-16 DIAGNOSIS — Z1231 Encounter for screening mammogram for malignant neoplasm of breast: Secondary | ICD-10-CM

## 2023-05-17 ENCOUNTER — Ambulatory Visit: Payer: 59 | Admitting: Physical Therapy

## 2023-05-17 DIAGNOSIS — M25521 Pain in right elbow: Secondary | ICD-10-CM | POA: Diagnosis not present

## 2023-05-17 DIAGNOSIS — M6281 Muscle weakness (generalized): Secondary | ICD-10-CM

## 2023-05-17 NOTE — Therapy (Signed)
OUTPATIENT PHYSICAL THERAPY TREATMENT/RECERTIFICATION   Patient Name: Rebecca Browning MRN: [id number] DOB:17-Mar-1966, 57 y.o., female Today's Date: 05/17/2023  END OF SESSION:  PT End of Session - 05/17/23 1453     Visit Number 7    Date for PT Re-Evaluation 07/12/23    Authorization Type [employer]et[employer]    PT Start Time 0933    PT Stop Time 1014    PT Time Calculation (min) 41 min    Activity Tolerance Patient tolerated treatment well                Past Medical History:  Diagnosis Date   Abnormal Pap smear of cervix 10/17/2018    2020 ASCUS HPV HR+, 2021 neg HPV HR +   Elevated cholesterol    Tendon tear    Past Surgical History:  Procedure Laterality Date   COLPOSCOPY W/ BIOPSY / CURETTAGE  11/01/2018   ASCUS + HPV   DILATION AND CURETTAGE OF UTERUS     SAB   LIPOSUCTION Bilateral 08/19/14   thighs   POPLITEAL SYNOVIAL CYST EXCISION  1970's   Patient Active Problem List   Diagnosis Date Noted   Lateral epicondylitis of right elbow 02/21/2018     REFERRING PROVIDER: Reino Bellis DO  REFERRING DIAG: medial epicondylitis  THERAPY DIAG:  Medial   Rationale for Evaluation and Treatment: Rehabilitation  ONSET DATE: February 2024  SUBJECTIVE:                                                                                                                                                                                      SUBJECTIVE STATEMENT: I've had a setback.  I played 3 tennis games in a row (2 in the same day).  I think it's worse than when I started.    Hand dominance: Right  PERTINENT HISTORY: Few years ago lateral elbow pain helped with DN (at [facility]) and exercise  PAIN:  PAIN:  Are you having pain? Yes NPRS scale: 5/10 Pain location: right medial elbow Aggravating factors: using Rt UE Relieving factors: elbow sleeve; been doing stretches prescribed by the doctor   PRECAUTIONS: None  WEIGHT BEARING RESTRICTIONS: No  FALLS:  Has  patient fallen in last 6 months? 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  PLOF: Independent  PATIENT GOALS: Be able to continue to play tennis; alleviate pain  NEXT MD VISIT: as needed  OBJECTIVE:    PATIENT SURVEYS :  Quick Dash 18%  COGNITION: Overall cognitive status: Within functional limits for tasks assessed      UPPER EXTREMITY ROM: All WNLs bilaterally  UPPER EXTREMITY MMT:  Left WNLs 5/5 Right scapular and glenohumeral strength 5/5 Elbow flexion and extension 5/5 Pronation 4/5 painful  Supination 5 Wrist flexion 4+ Wrist extension 5 Grip: 60# left; 60# on right painful;  would expect dominant side to be 10# stronger than non dominant Pinch test:  12# left, 14# right 05/08/23: 77# on Rt- no pain   PALPATION:  Tenderness right medial epicondyle   TODAY'S TREATMENT:    DATE: 815/24:  Discussed recent exacerbation and strategies to get back under control Recommend assisted wrist flexion followed by eccentric lowers 1-2# 3x10 daily to every other day Discussed use of counterforce brace just distal to medial epicondyle Discussed Fall/Winter conditioning plan (off season tennis/light play) Ionto #5-1.0 cc dex to Rt medial elbow 4 hour time- instructions provided.   Manual: soft tissue mobilization to wrist flexors, pronators Trigger Point Dry-Needling  Treatment instructions: Expect mild to moderate muscle soreness. S/S of pneumothorax if dry needled over a lung field, and to seek immediate medical attention should they occur. Patient verbalized understanding of these instructions and education.  Patient Consent Given: Yes Education handout provided: Previously provided Muscles treated:right wrist flexors and pronators, distal triceps Treatment response/outcome: Utilized skilled palpation to identify trigger points.  During dry needling able to palpate muscle twitch and muscle elongation  DATE: 05/10/23:  Discussed sleeve wear,  eccentrics and tennis Ionto #4-1.0 cc dex to Rt medial elbow 4 hour time- instructions provided.   Manual: soft tissue mobilization to wrist flexors, pronators Trigger Point Dry-Needling  Treatment instructions: Expect mild to moderate muscle soreness. S/S of pneumothorax if dry needled over a lung field, and to seek immediate medical attention should they occur. Patient verbalized understanding of these instructions and education.  Patient Consent Given: Yes Education handout provided: Previously provided Muscles treated:right wrist flexors and pronators Treatment response/outcome: Utilized skilled palpation to identify trigger points.  During dry needling able to palpate muscle twitch and muscle elongation  DATE: 8/1:  Red band combo: wrist flexion, slow lower  then pronation then slow return (eccentrics) 3 sets of 10; 1 minute rest between sets added to HEP to be done 3-4x/week Discussion of effect of eccentrics to promote healing Ionto #3-1.0 cc dex to Rt medial elbow 4 hour time- instructions provided.   Manual: soft tissue mobilization to wrist flexors, pronators Trigger Point Dry-Needling  Treatment instructions: Expect mild to moderate muscle soreness. S/S of pneumothorax if dry needled over a lung field, and to seek immediate medical attention should they occur. Patient verbalized understanding of these instructions and education.  Patient Consent Given: Yes Education handout provided: Previously provided Muscles treated:right wrist flexors and pronators Treatment response/outcome: Utilized skilled palpation to identify trigger points.  During dry needling able to palpate muscle twitch and muscle elongation    DATE: 7/23:  2# wrist flexion eccentric lowers 10x2 2# pronation 10x 2 Red FlexBar eccentric flexion 10x2 Red FlexBar pronation 10x2 Red FlexBar wrist flexion 10x2  Ionto #2-1.0 cc dex to Rt medial elbow 4 hour time- instructions provided.     Reviewed the importance of  shoulder strengthening as well  PATIENT EDUCATION: Education details: Educated patient on anatomy and physiology of current symptoms, prognosis, plan of care as well as initial self care strategies to promote recovery Person educated: Patient Education method: Explanation Education comprehension: verbalized understanding  HOME EXERCISE PROGRAM: Reviewed HEP provided by Dr. Margaretha Sheffield:  wrist flexor and extensor stretching, 1# (can) wrist flexor strengthening (discussed making it an eccentric lower for added benefit);  1# pronation/supination (showed  how to make it an eccentric movement); finger abduction/adduction intrinsic using rubber band  Discussed general shoulder health as a tennis player encouraged prone Ys or Y lift offs on wall Access Code: [email or web address] URL: [email or web address] Date: 05/03/2023 Prepared by: Lavinia Sharps  Exercises  3-4x/week  - Wrist Flexion with Resistance  - 1 x daily - 7 x weekly - 3 sets - 10 reps - Forearm Pronation with Resistance  - 1 x daily - 7 x weekly - 3 sets - 10 reps  ASSESSMENT:  CLINICAL IMPRESSION: The patient was progressing with a 40% overall improvement until a recent exacerbation following 3 tennis matches in a 2 day period.  Recommend continued PT to address this pain flare up and for a further progression of strengthening and functional mobility.  Will continue to update and promote independence in a HEP needed for a return to the highest functional level possible with recreational activity and ADLS.      OBJECTIVE IMPAIRMENTS: decreased activity tolerance, decreased strength, impaired perceived functional ability, impaired UE functional use, and pain.   ACTIVITY LIMITATIONS: lifting and recreation  PARTICIPATION LIMITATIONS: driving and tennis  PERSONAL FACTORS: Time since onset of injury/illness/exacerbation are also affecting patient's functional outcome.   REHAB POTENTIAL: Good  CLINICAL DECISION MAKING:  Stable/uncomplicated  EVALUATION COMPLEXITY: Low  GOALS: Goals reviewed with patient? Yes  SHORT TERM GOALS: Target date: 05/01/2023  The patient will demonstrate knowledge of basic self care strategies and exercises to promote healing  Baseline: resting from tennis, stretching and gentle strength, wearing brace (04/17/23) Goal status: MET  2.  The patient will report a 40% improvement in pain levels with functional activities which are currently difficult including playing tennis and changing gears in straight drive Baseline: 09% (05/02/18) Goal status: met   3.  Right grip strength improved to 65# of force  Baseline: 77# -05/08/23 Goal status: MET    LONG TERM GOALS: Target date: 07/12/2023    The patient will be independent in a safe self progression of a home exercise program to promote further recovery of function  Baseline:  Goal status: ongoing  2.  The patient will report a 75% improvement in pain levels with functional activities which are currently difficult including playing tennis and changing gears Baseline: 40% (05/08/23) Goal status: ongoing  3.  Right wrist flexion and pronation/supination strength improved to 5-/5 and grip strength to 70# needed for tennis Baseline: grip is 77# (05/08/23) Goal status: ongoing  4.  Quick DASH improved to 13%  Baseline:  Goal status: ongoing   PLAN: PT FREQUENCY: 1x/week  PT DURATION: 8 weeks  PLANNED INTERVENTIONS: Therapeutic exercises, Therapeutic activity, Neuromuscular re-education, Patient/Family education, Self Care, Joint mobilization, Dry Needling, Electrical stimulation, Cryotherapy, Moist heat, Taping, Ultrasound, Ionotophoresis 4mg /ml Dexamethasone, Manual therapy, and Re-evaluation  PLAN FOR NEXT SESSION:  ionto; repeat DN as needed; ice, progress HEP particularly eccentrics  Lavinia Sharps, PT 05/17/23 2:54 PM Phone: [phone] Fax: [phone]

## 2023-06-05 ENCOUNTER — Ambulatory Visit: Payer: 59 | Attending: Sports Medicine | Admitting: Physical Therapy

## 2023-06-05 DIAGNOSIS — M25521 Pain in right elbow: Secondary | ICD-10-CM | POA: Insufficient documentation

## 2023-06-05 DIAGNOSIS — M6281 Muscle weakness (generalized): Secondary | ICD-10-CM | POA: Diagnosis present

## 2023-06-05 NOTE — Therapy (Signed)
OUTPATIENT PHYSICAL THERAPY TREATMENT   Patient Name: Rebecca Browning MRN: [id number] DOB:[birth year], 57 y.o., female Today's Date: 06/05/2023  END OF SESSION:  PT End of Session - 06/05/23 1620     Visit Number 8    Date for PT Re-Evaluation 07/12/23    Authorization Type Aetna    PT Start Time 1618    PT Stop Time 1656    PT Time Calculation (min) 38 min    Activity Tolerance Patient tolerated treatment well                Past Medical History:  Diagnosis Date   Abnormal Pap smear of cervix 10/17/2018    2020 ASCUS HPV HR+, 2021 neg HPV HR +   Elevated cholesterol    Tendon tear    Past Surgical History:  Procedure Laterality Date   COLPOSCOPY W/ BIOPSY / CURETTAGE  11/01/2018   ASCUS + HPV   DILATION AND CURETTAGE OF UTERUS     SAB   LIPOSUCTION Bilateral 08/19/14   thighs   POPLITEAL SYNOVIAL CYST EXCISION  1970's   Patient Active Problem List   Diagnosis Date Noted   Lateral epicondylitis of right elbow 02/21/2018     REFERRING PROVIDER: Reino Bellis DO  REFERRING DIAG: medial epicondylitis  THERAPY DIAG:  Medial   Rationale for Evaluation and Treatment: Rehabilitation  ONSET DATE: February 2024  SUBJECTIVE:                                                                                                                                                                                      SUBJECTIVE STATEMENT: I haven't played tennis in a week and a half.  I've got a Music therapist.  Concerned about the forearm.  Got [employer] tournament playing several days in a row.   I haven't been doing my ex's consistently.   Hand dominance: Right  PERTINENT HISTORY: Few years ago lateral elbow pain helped with DN (at [facility]) and exercise  PAIN:  PAIN:  Are you having pain? Yes NPRS scale: 5/10 Pain location: right medial elbow Aggravating factors: using Rt UE Relieving factors: elbow sleeve; been doing stretches prescribed by the doctor    PRECAUTIONS: None  WEIGHT BEARING RESTRICTIONS: No  FALLS:  Has patient fallen in last 6 months? 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  PLOF: Independent  PATIENT GOALS: Be able to continue to play tennis; alleviate pain  NEXT MD VISIT: as needed  OBJECTIVE:    PATIENT SURVEYS :  Quick Dash 18%  COGNITION: Overall cognitive status: Within functional limits for tasks assessed  UPPER EXTREMITY ROM: All WNLs bilaterally  UPPER EXTREMITY MMT: Left WNLs 5/5 Right scapular and glenohumeral strength 5/5 Elbow flexion and extension 5/5 Pronation 4/5 painful  Supination 5 Wrist flexion 4+ Wrist extension 5 Grip: 60# left; 60# on right painful;  would expect dominant side to be 10# stronger than non dominant Pinch test:  12# left, 14# right 05/08/23: 77# on Rt- no pain   PALPATION:  Tenderness right medial epicondyle   TODAY'S TREATMENT:    06/05/23 Discussed importance of doing the ex's especially eccentrics 3x/week Red band wrist flexion and pronation combo 10x 3 sets assisted wrist flexion followed by eccentric lowers 1-2# 3x10 daily to every other day Discussed use of counterforce brace just distal to medial epicondyle or above elbow on distal triceps Ionto #6-1.0 cc dex to Rt medial elbow 4 hour time- instructions provided.   Manual: soft tissue mobilization to wrist flexors, pronators Trigger Point Dry-Needling  Treatment instructions: Expect mild to moderate muscle soreness. S/S of pneumothorax if dry needled over a lung field, and to seek immediate medical attention should they occur. Patient verbalized understanding of these instructions and education.  Patient Consent Given: Yes Education handout provided: Previously provided Muscles treated:right wrist flexors and pronators, distal triceps in supine Treatment response/outcome: Utilized skilled palpation to identify trigger points.  During dry needling able to palpate  muscle twitch and muscle elongation  DATE: 05/17/23:  Discussed recent exacerbation and strategies to get back under control Recommend assisted wrist flexion followed by eccentric lowers 1-2# 3x10 daily to every other day Discussed use of counterforce brace just distal to medial epicondyle Discussed Fall/Winter conditioning plan (off season tennis/light play) Ionto #5-1.0 cc dex to Rt medial elbow 4 hour time- instructions provided.   Manual: soft tissue mobilization to wrist flexors, pronators Trigger Point Dry-Needling  Treatment instructions: Expect mild to moderate muscle soreness. S/S of pneumothorax if dry needled over a lung field, and to seek immediate medical attention should they occur. Patient verbalized understanding of these instructions and education.  Patient Consent Given: Yes Education handout provided: Previously provided Muscles treated:right wrist flexors and pronators, distal triceps Treatment response/outcome: Utilized skilled palpation to identify trigger points.  During dry needling able to palpate muscle twitch and muscle elongation  DATE: 05/10/23:  Discussed sleeve wear, eccentrics and tennis Ionto #4-1.0 cc dex to Rt medial elbow 4 hour time- instructions provided.   Manual: soft tissue mobilization to wrist flexors, pronators Trigger Point Dry-Needling  Treatment instructions: Expect mild to moderate muscle soreness. S/S of pneumothorax if dry needled over a lung field, and to seek immediate medical attention should they occur. Patient verbalized understanding of these instructions and education.  Patient Consent Given: Yes Education handout provided: Previously provided Muscles treated:right wrist flexors and pronators Treatment response/outcome: Utilized skilled palpation to identify trigger points.  During dry needling able to palpate muscle twitch and muscle elongation  DATE: 8/1:  Red band combo: wrist flexion, slow lower  then pronation then slow return  (eccentrics) 3 sets of 10; 1 minute rest between sets added to HEP to be done 3-4x/week Discussion of effect of eccentrics to promote healing Ionto #3-1.0 cc dex to Rt medial elbow 4 hour time- instructions provided.   Manual: soft tissue mobilization to wrist flexors, pronators Trigger Point Dry-Needling  Treatment instructions: Expect mild to moderate muscle soreness. S/S of pneumothorax if dry needled over a lung field, and to seek immediate medical attention should they occur. Patient verbalized understanding of these instructions and education.  Patient Consent Given: Yes Education handout provided: Previously provided Muscles treated:right wrist flexors and pronators Treatment response/outcome: Utilized skilled palpation to identify trigger points.  During dry needling able to palpate muscle twitch and muscle elongation    PATIENT EDUCATION: Education details: Educated patient on anatomy and physiology of current symptoms, prognosis, plan of care as well as initial self care strategies to promote recovery Person educated: Patient Education method: Explanation Education comprehension: verbalized understanding  HOME EXERCISE PROGRAM: Reviewed HEP provided by Dr. Margaretha Sheffield:  wrist flexor and extensor stretching, 1# (can) wrist flexor strengthening (discussed making it an eccentric lower for added benefit);  1# pronation/supination (showed how to make it an eccentric movement); finger abduction/adduction intrinsic using rubber band  Discussed general shoulder health as a tennis player encouraged prone Ys or Y lift offs on wall Access Code: [email or web address] URL: [email or web address] Date: 05/03/2023 Prepared by: Lavinia Sharps  Exercises  3-4x/week  - Wrist Flexion with Resistance  - 1 x daily - 7 x weekly - 3 sets - 10 reps - Forearm Pronation with Resistance  - 1 x daily - 7 x weekly - 3 sets - 10 reps  ASSESSMENT:  CLINICAL IMPRESSION: Despite rest the past 1 1/2 weeks the  patient reports continued tenderness/pain on medial epicondyle.  Encouraged regular performance of HEP for best prognosis and shortening length of recovery.  Discussed management strategies for upcoming tennis tournament this weekend.  Therapist monitoring response to all interventions.     OBJECTIVE IMPAIRMENTS: decreased activity tolerance, decreased strength, impaired perceived functional ability, impaired UE functional use, and pain.   ACTIVITY LIMITATIONS: lifting and recreation  PARTICIPATION LIMITATIONS: driving and tennis  PERSONAL FACTORS: Time since onset of injury/illness/exacerbation are also affecting patient's functional outcome.   REHAB POTENTIAL: Good  CLINICAL DECISION MAKING: Stable/uncomplicated  EVALUATION COMPLEXITY: Low  GOALS: Goals reviewed with patient? Yes  SHORT TERM GOALS: Target date: 05/01/2023  The patient will demonstrate knowledge of basic self care strategies and exercises to promote healing  Baseline: resting from tennis, stretching and gentle strength, wearing brace (04/17/23) Goal status: MET  2.  The patient will report a 40% improvement in pain levels with functional activities which are currently difficult including playing tennis and changing gears in straight drive Baseline: 56% (11/02/28) Goal status: met   3.  Right grip strength improved to 65# of force  Baseline: 77# -05/08/23 Goal status: MET    LONG TERM GOALS: Target date: 07/12/2023    The patient will be independent in a safe self progression of a home exercise program to promote further recovery of function  Baseline:  Goal status: ongoing  2.  The patient will report a 75% improvement in pain levels with functional activities which are currently difficult including playing tennis and changing gears Baseline: 40% (05/08/23) Goal status: ongoing  3.  Right wrist flexion and pronation/supination strength improved to 5-/5 and grip strength to 70# needed for tennis Baseline:  grip is 77# (05/08/23) Goal status: ongoing  4.  Quick DASH improved to 13%  Baseline:  Goal status: ongoing   PLAN: PT FREQUENCY: 1x/week  PT DURATION: 8 weeks  PLANNED INTERVENTIONS: Therapeutic exercises, Therapeutic activity, Neuromuscular re-education, Patient/Family education, Self Care, Joint mobilization, Dry Needling, Electrical stimulation, Cryotherapy, Moist heat, Taping, Ultrasound, Ionotophoresis 4mg /ml Dexamethasone, Manual therapy, and Re-evaluation  PLAN FOR NEXT SESSION: see how tennis tournament went ([place]  ionto series completed;  DN may add ES;ice, progress HEP particularly eccentrics  Lavinia Sharps, PT 06/05/23 5:21 PM Phone: [phone] Fax:  [phone]         

## 2023-06-13 ENCOUNTER — Ambulatory Visit: Payer: 59 | Admitting: Physical Therapy

## 2023-06-13 DIAGNOSIS — M6281 Muscle weakness (generalized): Secondary | ICD-10-CM

## 2023-06-13 DIAGNOSIS — M25521 Pain in right elbow: Secondary | ICD-10-CM

## 2023-06-13 NOTE — Therapy (Signed)
OUTPATIENT PHYSICAL THERAPY TREATMENT   Patient Name: Rebecca Browning MRN: [id number] DOB:November 24, 1965, 57 y.o., female Today's Date: 06/13/2023  END OF SESSION:  PT End of Session - 06/13/23 0807     Visit Number 9    Date for PT Re-Evaluation 07/12/23    Authorization Type [employer]et[employer]    PT Start Time 0807    PT Stop Time 0845    PT Time Calculation (min) 38 min    Activity Tolerance Patient tolerated treatment well                Past Medical History:  Diagnosis Date   Abnormal Pap smear of cervix 10/17/2018    2020 ASCUS HPV HR+, 2021 neg HPV HR +   Elevated cholesterol    Tendon tear    Past Surgical History:  Procedure Laterality Date   COLPOSCOPY W/ BIOPSY / CURETTAGE  11/01/2018   ASCUS + HPV   DILATION AND CURETTAGE OF UTERUS     SAB   LIPOSUCTION Bilateral 08/19/14   thighs   POPLITEAL SYNOVIAL CYST EXCISION  1970's   Patient Active Problem List   Diagnosis Date Noted   Lateral epicondylitis of right elbow 02/21/2018     REFERRING PROVIDER: Reino Bellis DO  REFERRING DIAG: medial epicondylitis  THERAPY DIAG:  Medial   Rationale for Evaluation and Treatment: Rehabilitation  ONSET DATE: February 2024  SUBJECTIVE:                                                                                                                                                                                      SUBJECTIVE STATEMENT: We did well at [employer].  My elbow was bad. I'm not playing any this week.  I got a TENS unit.  I have pain today.  Everything hurts.  I definitely aggravated it playing several days in a row.  I'm going to take the winter off from tennis starting mid October.  I went back to my old racquet with looser strings.  I think this started with my new racket.    Hand dominance: Right  PERTINENT HISTORY: Few years ago lateral elbow pain helped with DN (at [facility]) and exercise  PAIN:  PAIN:  Are you having pain? Yes NPRS scale: 5/10 Pain  location: right medial elbow Aggravating factors: using Rt UE Relieving factors: elbow sleeve; been doing stretches prescribed by the doctor   PRECAUTIONS: None  WEIGHT BEARING RESTRICTIONS: No  FALLS:  Has patient fallen in last 6 months? 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  PLOF: Independent  PATIENT GOALS: Be able to continue to play  tennis; alleviate pain  NEXT MD VISIT: as needed  OBJECTIVE:    PATIENT SURVEYS :  Quick Dash 18%  COGNITION: Overall cognitive status: Within functional limits for tasks assessed      UPPER EXTREMITY ROM: All WNLs bilaterally  UPPER EXTREMITY MMT: Left WNLs 5/5 Right scapular and glenohumeral strength 5/5 Elbow flexion and extension 5/5 Pronation 4/5 painful  Supination 5 Wrist flexion 4+ Wrist extension 5 Grip: 60# left; 60# on right painful;  would expect dominant side to be 10# stronger than non dominant Pinch test:  12# left, 14# right 05/08/23: 77# on Rt- no pain   PALPATION:  Tenderness right medial epicondyle   TODAY'S TREATMENT:    06/13/23 Discussed importance of doing the ex's especially eccentrics 3x/week Red Flexbar eccentric flexion 20x (video on pt's phone) 2# eccentric wrist flexion 20x (video on pt's phone) 2# endcap pronation/supination 20x Discussed T and Y shoulder/scapular ex's prone or on wall  Discussed working with tennis pro to observe swing for modifications to grip to reduce medial elbow load or record video for obsevation  06/05/23 Discussed importance of doing the ex's especially eccentrics 3x/week Red band wrist flexion and pronation combo 10x 3 sets assisted wrist flexion followed by eccentric lowers 1-2# 3x10 daily to every other day Discussed use of counterforce brace just distal to medial epicondyle or above elbow on distal triceps Ionto #6-1.0 cc dex to Rt medial elbow 4 hour time- instructions provided.   Manual: soft tissue mobilization to wrist  flexors, pronators Trigger Point Dry-Needling  Treatment instructions: Expect mild to moderate muscle soreness. S/S of pneumothorax if dry needled over a lung field, and to seek immediate medical attention should they occur. Patient verbalized understanding of these instructions and education.  Patient Consent Given: Yes Education handout provided: Previously provided Muscles treated:right wrist flexors and pronators, distal triceps in supine Treatment response/outcome: Utilized skilled palpation to identify trigger points.  During dry needling able to palpate muscle twitch and muscle elongation  DATE: 05/17/23:  Discussed recent exacerbation and strategies to get back under control Recommend assisted wrist flexion followed by eccentric lowers 1-2# 3x10 daily to every other day Discussed use of counterforce brace just distal to medial epicondyle Discussed Fall/Winter conditioning plan (off season tennis/light play) Ionto #5-1.0 cc dex to Rt medial elbow 4 hour time- instructions provided.   Manual: soft tissue mobilization to wrist flexors, pronators Trigger Point Dry-Needling  Treatment instructions: Expect mild to moderate muscle soreness. S/S of pneumothorax if dry needled over a lung field, and to seek immediate medical attention should they occur. Patient verbalized understanding of these instructions and education.  Patient Consent Given: Yes Education handout provided: Previously provided Muscles treated:right wrist flexors and pronators, distal triceps Treatment response/outcome: Utilized skilled palpation to identify trigger points.  During dry needling able to palpate muscle twitch and muscle elongation  DATE: 05/10/23:  Discussed sleeve wear, eccentrics and tennis Ionto #4-1.0 cc dex to Rt medial elbow 4 hour time- instructions provided.   Manual: soft tissue mobilization to wrist flexors, pronators Trigger Point Dry-Needling  Treatment instructions: Expect mild to moderate muscle  soreness. S/S of pneumothorax if dry needled over a lung field, and to seek immediate medical attention should they occur. Patient verbalized understanding of these instructions and education.  Patient Consent Given: Yes Education handout provided: Previously provided Muscles treated:right wrist flexors and pronators Treatment response/outcome: Utilized skilled palpation to identify trigger points.  During dry needling   PATIENT EDUCATION: Education details: Educated patient on anatomy  and physiology of current symptoms, prognosis, plan of care as well as initial self care strategies to promote recovery Person educated: Patient Education method: Explanation Education comprehension: verbalized understanding  HOME EXERCISE PROGRAM: Video of eccentrics on pt's cell phone Reviewed HEP provided by Dr. Margaretha Sheffield:  wrist flexor and extensor stretching, 1# (can) wrist flexor strengthening (discussed making it an eccentric lower for added benefit);  1# pronation/supination (showed how to make it an eccentric movement); finger abduction/adduction intrinsic using rubber band  Discussed general shoulder health as a tennis player encouraged prone Ys or Y lift offs on wall Access Code: [email or web address] URL: [email or web address] Date: 05/03/2023 Prepared by: Lavinia Sharps  Exercises  3-4x/week  - Wrist Flexion with Resistance  - 1 x daily - 7 x weekly - 3 sets - 10 reps - Forearm Pronation with Resistance  - 1 x daily - 7 x weekly - 3 sets - 10 reps  ASSESSMENT:  CLINICAL IMPRESSION:  High symptom irritability to medial elbow following increased tennis from tournament play.  Encouraged regular eccentric wrist flexors 3x/week.  Recommend she work with her tennis pro to observe grip, swing and racket considerations.  Encouraged shoulder and scapular muscle strengthening as well to reduce overload to wrist flexors and pronators.     OBJECTIVE IMPAIRMENTS: decreased activity tolerance, decreased  strength, impaired perceived functional ability, impaired UE functional use, and pain.   ACTIVITY LIMITATIONS: lifting and recreation  PARTICIPATION LIMITATIONS: driving and tennis  PERSONAL FACTORS: Time since onset of injury/illness/exacerbation are also affecting patient's functional outcome.   REHAB POTENTIAL: Good  CLINICAL DECISION MAKING: Stable/uncomplicated  EVALUATION COMPLEXITY: Low  GOALS: Goals reviewed with patient? Yes  SHORT TERM GOALS: Target date: 05/01/2023  The patient will demonstrate knowledge of basic self care strategies and exercises to promote healing  Baseline: resting from tennis, stretching and gentle strength, wearing brace (04/17/23) Goal status: MET  2.  The patient will report a 40% improvement in pain levels with functional activities which are currently difficult including playing tennis and changing gears in straight drive Baseline: 14% (04/08/28) Goal status: met   3.  Right grip strength improved to 65# of force  Baseline: 77# -05/08/23 Goal status: MET    LONG TERM GOALS: Target date: 07/12/2023    The patient will be independent in a safe self progression of a home exercise program to promote further recovery of function  Baseline:  Goal status: ongoing  2.  The patient will report a 75% improvement in pain levels with functional activities which are currently difficult including playing tennis and changing gears Baseline: 40% (05/08/23) Goal status: ongoing  3.  Right wrist flexion and pronation/supination strength improved to 5-/5 and grip strength to 70# needed for tennis Baseline: grip is 77# (05/08/23) Goal status: ongoing  4.  Quick DASH improved to 13%  Baseline:  Goal status: ongoing   PLAN: PT FREQUENCY: 1x/week  PT DURATION: 8 weeks  PLANNED INTERVENTIONS: Therapeutic exercises, Therapeutic activity, Neuromuscular re-education, Patient/Family education, Self Care, Joint mobilization, Dry Needling, Electrical  stimulation, Cryotherapy, Moist heat, Taping, Ultrasound, Ionotophoresis 4mg /ml Dexamethasone, Manual therapy, and Re-evaluation  PLAN FOR NEXT SESSION: check video of tennis swing or results of lession with tennis pro;  ionto series completed;  DN may add ES;ice, progress HEP particularly eccentrics   Lavinia Sharps, PT 06/13/23 9:24 PM Phone: [phone] Fax: [phone]

## 2023-06-19 ENCOUNTER — Ambulatory Visit: Payer: 59 | Admitting: Physical Therapy

## 2023-06-19 DIAGNOSIS — M25521 Pain in right elbow: Secondary | ICD-10-CM

## 2023-06-19 DIAGNOSIS — M6281 Muscle weakness (generalized): Secondary | ICD-10-CM

## 2023-06-19 NOTE — Therapy (Addendum)
 OUTPATIENT PHYSICAL THERAPY TREATMENT/DISCHARGE SUMMARY   Patient Name: Rebecca Browning MRN: [id number] DOB:18-Feb-1966, 57 y.o., female Today's Date: 06/19/2023  END OF SESSION:  PT End of Session - 06/19/23 1101     Visit Number 10    Date for PT Re-Evaluation 07/12/23    Authorization Type Aetna    PT Start Time 1101    PT Stop Time 1140    PT Time Calculation (min) 39 min    Activity Tolerance Patient tolerated treatment well                Past Medical History:  Diagnosis Date   Abnormal Pap smear of cervix 10/17/2018    2020 ASCUS HPV HR+, 2021 neg HPV HR +   Elevated cholesterol    Tendon tear    Past Surgical History:  Procedure Laterality Date   COLPOSCOPY W/ BIOPSY / CURETTAGE  11/01/2018   ASCUS + HPV   DILATION AND CURETTAGE OF UTERUS     SAB   LIPOSUCTION Bilateral 08/19/14   thighs   POPLITEAL SYNOVIAL CYST EXCISION  1970's   Patient Active Problem List   Diagnosis Date Noted   Lateral epicondylitis of right elbow 02/21/2018     REFERRING PROVIDER: Reino Bellis DO  REFERRING DIAG: medial epicondylitis  THERAPY DIAG:  Medial   Rationale for Evaluation and Treatment: Rehabilitation  ONSET DATE: February 2024  SUBJECTIVE:                                                                                                                                                                                      SUBJECTIVE STATEMENT: No better.  Not any different.    Hand dominance: Right  PERTINENT HISTORY: Few years ago lateral elbow pain helped with DN (at [facility]) and exercise  PAIN:  PAIN:  Are you having pain? Yes NPRS scale: 5/10 Pain location: right medial elbow Aggravating factors: using Rt UE Relieving factors: elbow sleeve; been doing stretches prescribed by the doctor   PRECAUTIONS: None  WEIGHT BEARING RESTRICTIONS: No  FALLS:  Has patient fallen in last 6 months? 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  PLOF: Independent  PATIENT GOALS: Be able to continue to play tennis; alleviate pain  NEXT MD VISIT: as needed  OBJECTIVE:    PATIENT SURVEYS :  Quick Dash 18%  COGNITION: Overall cognitive status: Within functional limits for tasks assessed      UPPER EXTREMITY ROM: All WNLs bilaterally  UPPER EXTREMITY MMT: Left WNLs 5/5 Right scapular and glenohumeral strength 5/5 Elbow flexion and extension 5/5 Pronation 4/5 painful  Supination 5  Wrist flexion 4+ Wrist extension 5 Grip: 60# left; 60# on right painful;  would expect dominant side to be 10# stronger than non dominant Pinch test:  12# left, 14# right 05/08/23: 77# on Rt- no pain   PALPATION:  Tenderness right medial epicondyle   TODAY'S TREATMENT:    06/19/23 Discussed biomechanical issues with playing tennis with timing that could be overloading wrist flexors and pronators and strategies for correction (ball contact to the side or front for neutral wrist position) Discussed the effects of a stiff racket and effects on medial elbow (info provided on strings-softer better hybrid) Review of eccentrics HEP Discussed time for healing and focus on Winter strengthening of shoulder, scapular muscles but also core and hips for generating power for tennis  Manual: soft tissue mobilization to wrist flexors, pronators Trigger Point Dry-Needling  Treatment instructions: Expect mild to moderate muscle soreness. S/S of pneumothorax if dry needled over a lung field, and to seek immediate medical attention should they occur. Patient verbalized understanding of these instructions and education.  Patient Consent Given: Yes Education handout provided: Previously provided Muscles treated:right wrist flexors and pronators, distal triceps in supine Treatment response/outcome: Utilized skilled palpation to identify trigger points.  During dry needling able to palpate muscle twitch and muscle  elongation  06/13/23 Discussed importance of doing the ex's especially eccentrics 3x/week Red Flexbar eccentric flexion 20x (video on pt's phone) 2# eccentric wrist flexion 20x (video on pt's phone) 2# endcap pronation/supination 20x Discussed T and Y shoulder/scapular ex's prone or on wall  Discussed working with tennis pro to observe swing for modifications to grip to reduce medial elbow load or record video for obsevation  06/05/23 Discussed importance of doing the ex's especially eccentrics 3x/week Red band wrist flexion and pronation combo 10x 3 sets assisted wrist flexion followed by eccentric lowers 1-2# 3x10 daily to every other day Discussed use of counterforce brace just distal to medial epicondyle or above elbow on distal triceps Ionto #6-1.0 cc dex to Rt medial elbow 4 hour time- instructions provided.   Manual: soft tissue mobilization to wrist flexors, pronators Trigger Point Dry-Needling  Treatment instructions: Expect mild to moderate muscle soreness. S/S of pneumothorax if dry needled over a lung field, and to seek immediate medical attention should they occur. Patient verbalized understanding of these instructions and education.  Patient Consent Given: Yes Education handout provided: Previously provided Muscles treated:right wrist flexors and pronators, distal triceps in supine Treatment response/outcome: Utilized skilled palpation to identify trigger points.  During dry needling able to palpate muscle twitch and muscle elongation  DATE: 05/17/23:  Discussed recent exacerbation and strategies to get back under control Recommend assisted wrist flexion followed by eccentric lowers 1-2# 3x10 daily to every other day Discussed use of counterforce brace just distal to medial epicondyle Discussed Fall/Winter conditioning plan (off season tennis/light play) Ionto #5-1.0 cc dex to Rt medial elbow 4 hour time- instructions provided.   Manual: soft tissue mobilization to wrist  flexors, pronators Trigger Point Dry-Needling  Treatment instructions: Expect mild to moderate muscle soreness. S/S of pneumothorax if dry needled over a lung field, and to seek immediate medical attention should they occur. Patient verbalized understanding of these instructions and education.  Patient Consent Given: Yes Education handout provided: Previously provided Muscles treated:right wrist flexors and pronators, distal triceps Treatment response/outcome: Utilized skilled palpation to identify trigger points.  During dry needling able to palpate muscle twitch and muscle elongation   PATIENT EDUCATION: Education details: Educated patient on anatomy and  physiology of current symptoms, prognosis, plan of care as well as initial self care strategies to promote recovery Person educated: Patient Education method: Explanation Education comprehension: verbalized understanding  HOME EXERCISE PROGRAM: Video of eccentrics on pt's cell phone Reviewed HEP provided by Dr. Margaretha Sheffield:  wrist flexor and extensor stretching, 1# (can) wrist flexor strengthening (discussed making it an eccentric lower for added benefit);  1# pronation/supination (showed how to make it an eccentric movement); finger abduction/adduction intrinsic using rubber band  Discussed general shoulder health as a tennis player encouraged prone Ys or Y lift offs on wall Access Code: [email or web address] URL: [email or web address] Date: 05/03/2023 Prepared by: Lavinia Sharps  Exercises  3-4x/week  - Wrist Flexion with Resistance  - 1 x daily - 7 x weekly - 3 sets - 10 reps - Forearm Pronation with Resistance  - 1 x daily - 7 x weekly - 3 sets - 10 reps  ASSESSMENT:  CLINICAL IMPRESSION:  Continues to have high symptom irritability.  Discussed and provided with info on tennis implications in affecting medial elbow pain.  She plans on working with her tennis pro to help analyze forehand swing as well as racket and string stiffness.   She has been instructed in a HEP including eccentric strengthening of wrist flexors and pronators and is independent in this program.  Will put on hold at this time.  If pt has not called or scheduled for follow up by the end of October will discharge from PT.   OBJECTIVE IMPAIRMENTS: decreased activity tolerance, decreased strength, impaired perceived functional ability, impaired UE functional use, and pain.   ACTIVITY LIMITATIONS: lifting and recreation  PARTICIPATION LIMITATIONS: driving and tennis  PERSONAL FACTORS: Time since onset of injury/illness/exacerbation are also affecting patient's functional outcome.   REHAB POTENTIAL: Good  CLINICAL DECISION MAKING: Stable/uncomplicated  EVALUATION COMPLEXITY: Low  GOALS: Goals reviewed with patient? Yes  SHORT TERM GOALS: Target date: 05/01/2023  The patient will demonstrate knowledge of basic self care strategies and exercises to promote healing  Baseline: resting from tennis, stretching and gentle strength, wearing brace (04/17/23) Goal status: MET  2.  The patient will report a 40% improvement in pain levels with functional activities which are currently difficult including playing tennis and changing gears in straight drive Baseline: 16% (1/0/96) Goal status: met   3.  Right grip strength improved to 65# of force  Baseline: 77# -05/08/23 Goal status: MET    LONG TERM GOALS: Target date: 07/12/2023    The patient will be independent in a safe self progression of a home exercise program to promote further recovery of function  Baseline:  Goal status: met 9/17  2.  The patient will report a 75% improvement in pain levels with functional activities which are currently difficult including playing tennis and changing gears Baseline: 40% (05/08/23) Goal status: ongoing  3.  Right wrist flexion and pronation/supination strength improved to 5-/5 and grip strength to 70# needed for tennis Baseline: grip is 77# (05/08/23) Goal  status: ongoing  4.  Quick DASH improved to 13%  Baseline:  Goal status: ongoing   PLAN: PT FREQUENCY: 1x/week  PT DURATION: 8 weeks  PLANNED INTERVENTIONS: Therapeutic exercises, Therapeutic activity, Neuromuscular re-education, Patient/Family education, Self Care, Joint mobilization, Dry Needling, Electrical stimulation, Cryotherapy, Moist heat, Taping, Ultrasound, Ionotophoresis 4mg /ml Dexamethasone, Manual therapy, and Re-evaluation  PLAN FOR NEXT SESSION:if no follow up by end of Oct, will discharge from PT  Lavinia Sharps, PT 06/19/23 8:40 PM Phone: [phone] Fax: [phone] PHYSICAL THERAPY DISCHARGE  SUMMARY  Visits from Start of Care: 10  Current functional level related to goals / functional outcomes: Has not called to resume    Remaining deficits: As above   Education / Equipment: HEP    Patient goals were partially met. Patient is being discharged due to not returning since the last visit.  Lavinia Sharps, PT 01/08/24 10:00 AM Phone: [phone] Fax: [phone]

## 2024-04-14 ENCOUNTER — Other Ambulatory Visit: Payer: Self-pay | Admitting: Internal Medicine

## 2024-04-14 DIAGNOSIS — Z1231 Encounter for screening mammogram for malignant neoplasm of breast: Secondary | ICD-10-CM

## 2024-05-20 ENCOUNTER — Ambulatory Visit
Admission: RE | Admit: 2024-05-20 | Discharge: 2024-05-20 | Disposition: A | Discharge status: Home / Self Care | Source: Ambulatory Visit | Attending: Internal Medicine | Admitting: Internal Medicine

## 2024-05-20 ENCOUNTER — Ambulatory Visit (INDEPENDENT_AMBULATORY_CARE_PROVIDER_SITE_OTHER): Admitting: Radiology

## 2024-05-20 ENCOUNTER — Encounter: Payer: Self-pay | Admitting: Radiology

## 2024-05-20 ENCOUNTER — Other Ambulatory Visit (HOSPITAL_COMMUNITY)
Admission: RE | Admit: 2024-05-20 | Discharge: 2024-05-20 | Disposition: A | Discharge status: Home / Self Care | Source: Ambulatory Visit | Attending: Radiology | Admitting: Radiology

## 2024-05-20 VITALS — BP 124/80 | HR 69 | Ht 64.75 in | Wt 160.0 lb

## 2024-05-20 DIAGNOSIS — Z01419 Encounter for gynecological examination (general) (routine) without abnormal findings: Secondary | ICD-10-CM | POA: Diagnosis not present

## 2024-05-20 DIAGNOSIS — Z1331 Encounter for screening for depression: Secondary | ICD-10-CM

## 2024-05-20 DIAGNOSIS — E28319 Asymptomatic premature menopause: Secondary | ICD-10-CM | POA: Diagnosis not present

## 2024-05-20 DIAGNOSIS — Z1211 Encounter for screening for malignant neoplasm of colon: Secondary | ICD-10-CM

## 2024-05-20 DIAGNOSIS — Z1231 Encounter for screening mammogram for malignant neoplasm of breast: Secondary | ICD-10-CM

## 2024-05-20 NOTE — Progress Notes (Signed)
 Rebecca Browning 07/19/1966 [id number]   History:  58 y.o. G4P3 presents for annual exam. No gyn concerns. Has a PCP, has mammogram this morning. Plays tennis and walks for exercise.  Gynecologic History Patient's last menstrual period was 01/03/2010 (exact date).   Contraception/Family planning: post menopausal status Sexually active: yes Last Pap: 2023. Results were: normal Last mammogram: 05/20/24. Results were: normal Colonoscopy: 2015   Obstetric History OB History  Gravida Para Term Preterm AB Living  4 3 3  0 1 3  SAB IAB Ectopic Multiple Live Births  1 0 0 0 3    # Outcome Date GA Lbr Len/2nd Weight Sex Type Anes PTL Lv  4 Term 2002    F Vag-Spont   LIV  3 Term 1998    F Vag-Spont   LIV  2 Term 1995    F Vag-Spont   LIV  1 SAB 1994    U    DEC     Birth Comments: D&C       05/20/2024   10:56 AM  Depression screen PHQ 2/9  Decreased Interest 0  Down, Depressed, Hopeless 0  PHQ - 2 Score 0     The following portions of the patient's history were reviewed and updated as appropriate: allergies, current medications, past family history, past medical history, past social history, past surgical history, and problem list.  Review of Systems  All other systems reviewed and are negative.   Past medical history, past surgical history, family history and social history were all reviewed and documented in the EPIC chart.  Exam:  Vitals:   05/20/24 1054  BP: 124/80  Pulse: 69  SpO2: 96%  Weight: 160 lb (72.6 kg)  Height: 5' 4.75 (1.645 m)   Body mass index is 26.83 kg/m.  Physical Exam Vitals and nursing note reviewed. Exam conducted with a chaperone present.  Constitutional:      Appearance: Normal appearance. She is normal weight.  HENT:     Head: Normocephalic and atraumatic.  Neck:     Thyroid: No thyroid mass, thyromegaly or thyroid tenderness.  Cardiovascular:     Rate and Rhythm: Regular rhythm.     Heart sounds: Normal heart sounds.  Pulmonary:      Effort: Pulmonary effort is normal.     Breath sounds: Normal breath sounds.  Chest:  Breasts:    Breasts are symmetrical.     Right: Normal. No inverted nipple, mass, nipple discharge, skin change or tenderness.     Left: Normal. No inverted nipple, mass, nipple discharge, skin change or tenderness.  Abdominal:     General: Abdomen is flat. Bowel sounds are normal.     Palpations: Abdomen is soft.  Genitourinary:    General: Normal vulva.     Vagina: Normal. No vaginal discharge, bleeding or lesions.     Cervix: Normal. No discharge or lesion.     Uterus: Normal. Not enlarged and not tender.      Adnexa: Right adnexa normal and left adnexa normal.       Right: No mass, tenderness or fullness.         Left: No mass, tenderness or fullness.    Lymphadenopathy:     Upper Body:     Right upper body: No axillary adenopathy.     Left upper body: No axillary adenopathy.  Skin:    General: Skin is warm and dry.  Neurological:     Mental Status: She is alert and oriented to  person, place, and time.  Psychiatric:        Mood and Affect: Mood normal.        Thought Content: Thought content normal.        Judgment: Judgment normal.      Darice Hoit, CMA present for exam  Assessment/Plan:   1. Well woman exam with routine gynecological exam (Primary) - Mammo this morning - Labs with PCP - Cytology - PAP( [place])  2. Depression screen  3. Colon cancer screening - Cologuard  4. Early menopause - DG Bone Density; Future   Return in about 1 year (around 05/20/2025) for Annual.  Aylssa Herrig B WHNP-BC 11:14 AM 05/20/2024

## 2024-05-20 NOTE — Patient Instructions (Signed)
 Preventive Care 58-60 Years Old, Female  Preventive care refers to lifestyle choices and visits with your health care provider that can promote health and wellness. Preventive care visits are also called wellness exams.  What can I expect for my preventive care visit?  Counseling  Your health care provider may ask you questions about your:  Medical history, including:  Past medical problems.  Family medical history.  Pregnancy history.  Current health, including:  Menstrual cycle.  Method of birth control.  Emotional well-being.  Home life and relationship well-being.  Sexual activity and sexual health.  Lifestyle, including:  Alcohol, nicotine or tobacco, and drug use.  Access to firearms.  Diet, exercise, and sleep habits.  Work and work Astronomer.  Sunscreen use.  Safety issues such as seatbelt and bike helmet use.  Physical exam  Your health care provider will check your:  Height and weight. These may be used to calculate your BMI (body mass index). BMI is a measurement that tells if you are at a healthy weight.  Waist circumference. This measures the distance around your waistline. This measurement also tells if you are at a healthy weight and may help predict your risk of certain diseases, such as type 2 diabetes and high blood pressure.  Heart rate and blood pressure.  Body temperature.  Skin for abnormal spots.  What immunizations do I need?    Vaccines are usually given at various ages, according to a schedule. Your health care provider will recommend vaccines for you based on your age, medical history, and lifestyle or other factors, such as travel or where you work.  What tests do I need?  Screening  Your health care provider may recommend screening tests for certain conditions. This may include:  Lipid and cholesterol levels.  Diabetes screening. This is done by checking your blood sugar (glucose) after you have not eaten for a while (fasting).  Pelvic exam and Pap test.  Hepatitis B test.  Hepatitis C  test.  HIV (human immunodeficiency virus) test.  STI (sexually transmitted infection) testing, if you are at risk.  Lung cancer screening.  Colorectal cancer screening.  Mammogram. Talk with your health care provider about when you should start having regular mammograms. This may depend on whether you have a family history of breast cancer.  BRCA-related cancer screening. This may be done if you have a family history of breast, ovarian, tubal, or peritoneal cancers.  Bone density scan. This is done to screen for osteoporosis.  Talk with your health care provider about your test results, treatment options, and if necessary, the need for more tests.  Follow these instructions at home:  Eating and drinking    Eat a diet that includes fresh fruits and vegetables, whole grains, lean protein, and low-fat dairy products.  Take vitamin and mineral supplements as recommended by your health care provider.  Do not drink alcohol if:  Your health care provider tells you not to drink.  You are pregnant, may be pregnant, or are planning to become pregnant.  If you drink alcohol:  Limit how much you have to 0-1 drink a day.  Know how much alcohol is in your drink. In the [place] one drink equals one 12 oz bottle of beer (355 mL), one 5 oz glass of wine (148 mL), or one 1 oz glass of hard liquor (44 mL).  Lifestyle  Brush your teeth every morning and night with fluoride toothpaste. Floss one time each day.  Exercise for at least  30 minutes 5 or more days each week.  Do not use any products that contain nicotine or tobacco. These products include cigarettes, chewing tobacco, and vaping devices, such as e-cigarettes. If you need help quitting, ask your health care provider.  Do not use drugs.  If you are sexually active, practice safe sex. Use a condom or other form of protection to prevent STIs.  If you do not wish to become pregnant, use a form of birth control. If you plan to become pregnant, see your health care provider for a  prepregnancy visit.  Take aspirin only as told by your health care provider. Make sure that you understand how much to take and what form to take. Work with your health care provider to find out whether it is safe and beneficial for you to take aspirin daily.  Find healthy ways to manage stress, such as:  Meditation, yoga, or listening to music.  Journaling.  Talking to a trusted person.  Spending time with friends and family.  Minimize exposure to UV radiation to reduce your risk of skin cancer.  Safety  Always wear your seat belt while driving or riding in a vehicle.  Do not drive:  If you have been drinking alcohol. Do not ride with someone who has been drinking.  When you are tired or distracted.  While texting.  If you have been using any mind-altering substances or drugs.  Wear a helmet and other protective equipment during sports activities.  If you have firearms in your house, make sure you follow all gun safety procedures.  Seek help if you have been physically or sexually abused.  What's next?  Visit your health care provider once a year for an annual wellness visit.  Ask your health care provider how often you should have your eyes and teeth checked.  Stay up to date on all vaccines.  This information is not intended to replace advice given to you by your health care provider. Make sure you discuss any questions you have with your health care provider.  Document Revised: 03/16/2021 Document Reviewed: 03/16/2021  Elsevier Patient Education  2024 [employer].

## 2024-05-22 ENCOUNTER — Ambulatory Visit: Payer: Self-pay | Admitting: Radiology

## 2024-05-22 LAB — CYTOLOGY - PAP
Comment: NEGATIVE
Diagnosis: NEGATIVE
High risk HPV: NEGATIVE

## 2024-06-11 LAB — COLOGUARD: COLOGUARD: NEGATIVE

## 2024-07-10 ENCOUNTER — Other Ambulatory Visit: Payer: Self-pay | Admitting: Medical Genetics

## 2024-08-13 ENCOUNTER — Ambulatory Visit (HOSPITAL_BASED_OUTPATIENT_CLINIC_OR_DEPARTMENT_OTHER)
Admission: RE | Admit: 2024-08-13 | Discharge: 2024-08-13 | Disposition: A | Discharge status: Home / Self Care | Source: Ambulatory Visit | Attending: Radiology | Admitting: Radiology

## 2024-08-13 DIAGNOSIS — E28319 Asymptomatic premature menopause: Secondary | ICD-10-CM | POA: Insufficient documentation

## 2024-08-19 ENCOUNTER — Telehealth: Payer: Self-pay

## 2024-08-19 NOTE — Telephone Encounter (Signed)
 Patient called and needed her cologuard result sent to Dr Kristie. I routed the result to Dr Kristie & I told the patient to let us  know if they had any problems getting the result.

## 2025-05-21 ENCOUNTER — Ambulatory Visit: Admitting: Radiology
